# Patient Record
Sex: Female | Born: 1940 | Race: White | Hispanic: No | Marital: Married | Smoking: Never smoker
Health system: Southern US, Community
[De-identification: ages and names within clinical notes are randomized; demographics above are authoritative.]

## PROBLEM LIST (undated history)

## (undated) DIAGNOSIS — C801 Malignant (primary) neoplasm, unspecified: Secondary | ICD-10-CM

## (undated) DIAGNOSIS — J45909 Unspecified asthma, uncomplicated: Secondary | ICD-10-CM

## (undated) DIAGNOSIS — K8020 Calculus of gallbladder without cholecystitis without obstruction: Secondary | ICD-10-CM

## (undated) DIAGNOSIS — I10 Essential (primary) hypertension: Secondary | ICD-10-CM

## (undated) DIAGNOSIS — I5021 Acute systolic (congestive) heart failure: Secondary | ICD-10-CM

## (undated) DIAGNOSIS — N200 Calculus of kidney: Secondary | ICD-10-CM

## (undated) DIAGNOSIS — K745 Biliary cirrhosis, unspecified: Secondary | ICD-10-CM

## (undated) HISTORY — PX: CATARACT EXTRACTION: SUR2

## (undated) HISTORY — PX: REPLACEMENT TOTAL KNEE: SUR1224

## (undated) HISTORY — PX: BREAST LUMPECTOMY: SHX2

---

## 2016-01-17 ENCOUNTER — Encounter (HOSPITAL_COMMUNITY)
Admission: EM | Disposition: A | Discharge status: Home / Self Care | Payer: Self-pay | Source: Home / Self Care | Attending: Oncology

## 2016-01-17 ENCOUNTER — Encounter (HOSPITAL_COMMUNITY): Payer: Self-pay | Admitting: Emergency Medicine

## 2016-01-17 ENCOUNTER — Inpatient Hospital Stay (HOSPITAL_COMMUNITY)
Admission: EM | Admit: 2016-01-17 | Discharge: 2016-01-20 | DRG: 872 | Disposition: A | Discharge status: Home / Self Care | Payer: Medicare Other | Attending: Oncology | Admitting: Oncology

## 2016-01-17 ENCOUNTER — Emergency Department (HOSPITAL_COMMUNITY): Payer: Medicare Other

## 2016-01-17 DIAGNOSIS — Z87440 Personal history of urinary (tract) infections: Secondary | ICD-10-CM

## 2016-01-17 DIAGNOSIS — Z9221 Personal history of antineoplastic chemotherapy: Secondary | ICD-10-CM

## 2016-01-17 DIAGNOSIS — Z419 Encounter for procedure for purposes other than remedying health state, unspecified: Secondary | ICD-10-CM

## 2016-01-17 DIAGNOSIS — I5021 Acute systolic (congestive) heart failure: Secondary | ICD-10-CM | POA: Insufficient documentation

## 2016-01-17 DIAGNOSIS — N390 Urinary tract infection, site not specified: Secondary | ICD-10-CM

## 2016-01-17 DIAGNOSIS — Z6841 Body Mass Index (BMI) 40.0 and over, adult: Secondary | ICD-10-CM

## 2016-01-17 DIAGNOSIS — G4733 Obstructive sleep apnea (adult) (pediatric): Secondary | ICD-10-CM | POA: Insufficient documentation

## 2016-01-17 DIAGNOSIS — I5020 Unspecified systolic (congestive) heart failure: Secondary | ICD-10-CM | POA: Diagnosis present

## 2016-01-17 DIAGNOSIS — K743 Primary biliary cirrhosis: Secondary | ICD-10-CM | POA: Diagnosis present

## 2016-01-17 DIAGNOSIS — E662 Morbid (severe) obesity with alveolar hypoventilation: Secondary | ICD-10-CM | POA: Diagnosis present

## 2016-01-17 DIAGNOSIS — Z87442 Personal history of urinary calculi: Secondary | ICD-10-CM

## 2016-01-17 DIAGNOSIS — A419 Sepsis, unspecified organism: Principal | ICD-10-CM | POA: Diagnosis present

## 2016-01-17 DIAGNOSIS — J45909 Unspecified asthma, uncomplicated: Secondary | ICD-10-CM | POA: Diagnosis present

## 2016-01-17 DIAGNOSIS — A4151 Sepsis due to Escherichia coli [E. coli]: Secondary | ICD-10-CM | POA: Insufficient documentation

## 2016-01-17 DIAGNOSIS — Z87891 Personal history of nicotine dependence: Secondary | ICD-10-CM

## 2016-01-17 DIAGNOSIS — K745 Biliary cirrhosis, unspecified: Secondary | ICD-10-CM | POA: Diagnosis present

## 2016-01-17 DIAGNOSIS — Z9989 Dependence on other enabling machines and devices: Secondary | ICD-10-CM

## 2016-01-17 DIAGNOSIS — R1032 Left lower quadrant pain: Secondary | ICD-10-CM | POA: Diagnosis not present

## 2016-01-17 DIAGNOSIS — R0902 Hypoxemia: Secondary | ICD-10-CM | POA: Diagnosis not present

## 2016-01-17 DIAGNOSIS — N136 Pyonephrosis: Secondary | ICD-10-CM | POA: Diagnosis present

## 2016-01-17 DIAGNOSIS — I2510 Atherosclerotic heart disease of native coronary artery without angina pectoris: Secondary | ICD-10-CM | POA: Diagnosis present

## 2016-01-17 DIAGNOSIS — N200 Calculus of kidney: Secondary | ICD-10-CM | POA: Diagnosis present

## 2016-01-17 DIAGNOSIS — Z923 Personal history of irradiation: Secondary | ICD-10-CM

## 2016-01-17 DIAGNOSIS — Z853 Personal history of malignant neoplasm of breast: Secondary | ICD-10-CM

## 2016-01-17 DIAGNOSIS — K219 Gastro-esophageal reflux disease without esophagitis: Secondary | ICD-10-CM | POA: Diagnosis present

## 2016-01-17 DIAGNOSIS — N201 Calculus of ureter: Secondary | ICD-10-CM | POA: Diagnosis present

## 2016-01-17 DIAGNOSIS — R0989 Other specified symptoms and signs involving the circulatory and respiratory systems: Secondary | ICD-10-CM

## 2016-01-17 DIAGNOSIS — N3090 Cystitis, unspecified without hematuria: Secondary | ICD-10-CM | POA: Diagnosis present

## 2016-01-17 DIAGNOSIS — N179 Acute kidney failure, unspecified: Secondary | ICD-10-CM | POA: Diagnosis present

## 2016-01-17 DIAGNOSIS — I110 Hypertensive heart disease with heart failure: Secondary | ICD-10-CM | POA: Diagnosis present

## 2016-01-17 HISTORY — DX: Malignant (primary) neoplasm, unspecified: C80.1

## 2016-01-17 HISTORY — PX: CYSTOSCOPY W/ URETERAL STENT PLACEMENT: SHX1429

## 2016-01-17 HISTORY — DX: Unspecified asthma, uncomplicated: J45.909

## 2016-01-17 HISTORY — DX: Essential (primary) hypertension: I10

## 2016-01-17 HISTORY — DX: Acute systolic (congestive) heart failure: I50.21

## 2016-01-17 HISTORY — DX: Calculus of gallbladder without cholecystitis without obstruction: K80.20

## 2016-01-17 HISTORY — DX: Biliary cirrhosis, unspecified: K74.5

## 2016-01-17 HISTORY — DX: Calculus of kidney: N20.0

## 2016-01-17 LAB — CBC
HCT: 38.6 % (ref 36.0–46.0)
Hemoglobin: 12.3 g/dL (ref 12.0–15.0)
MCH: 27.9 pg (ref 26.0–34.0)
MCHC: 31.9 g/dL (ref 30.0–36.0)
MCV: 87.5 fL (ref 78.0–100.0)
PLATELETS: 285 10*3/uL (ref 150–400)
RBC: 4.41 MIL/uL (ref 3.87–5.11)
RDW: 13.9 % (ref 11.5–15.5)
WBC: 10.8 10*3/uL — AB (ref 4.0–10.5)

## 2016-01-17 LAB — URINALYSIS, ROUTINE W REFLEX MICROSCOPIC
Bilirubin Urine: NEGATIVE
Glucose, UA: NEGATIVE mg/dL
Ketones, ur: NEGATIVE mg/dL
Nitrite: NEGATIVE
PROTEIN: NEGATIVE mg/dL
Specific Gravity, Urine: 1.02 (ref 1.005–1.030)
pH: 6 (ref 5.0–8.0)

## 2016-01-17 LAB — COMPREHENSIVE METABOLIC PANEL
ALK PHOS: 63 U/L (ref 38–126)
ALT: 23 U/L (ref 14–54)
AST: 46 U/L — ABNORMAL HIGH (ref 15–41)
Albumin: 3.7 g/dL (ref 3.5–5.0)
Anion gap: 10 (ref 5–15)
BUN: 14 mg/dL (ref 6–20)
CALCIUM: 9.3 mg/dL (ref 8.9–10.3)
CHLORIDE: 102 mmol/L (ref 101–111)
CO2: 24 mmol/L (ref 22–32)
CREATININE: 0.99 mg/dL (ref 0.44–1.00)
GFR, EST NON AFRICAN AMERICAN: 54 mL/min — AB (ref >60)
Glucose, Bld: 127 mg/dL — ABNORMAL HIGH (ref 65–99)
Potassium: 4.7 mmol/L (ref 3.5–5.1)
Sodium: 136 mmol/L (ref 135–145)
Total Bilirubin: 1.4 mg/dL — ABNORMAL HIGH (ref 0.3–1.2)
Total Protein: 7.2 g/dL (ref 6.5–8.1)

## 2016-01-17 LAB — URINE MICROSCOPIC-ADD ON

## 2016-01-17 LAB — I-STAT CG4 LACTIC ACID, ED: Lactic Acid, Venous: 3.93 mmol/L (ref 0.5–2.0)

## 2016-01-17 SURGERY — CYSTOSCOPY, WITH RETROGRADE PYELOGRAM AND URETERAL STENT INSERTION
Anesthesia: General | Site: Pelvis | Laterality: Left

## 2016-01-17 MED ORDER — ONDANSETRON HCL 4 MG/2ML IJ SOLN
4.0000 mg | Freq: Once | INTRAMUSCULAR | Status: AC
  Administered 2016-01-17: 4 mg via INTRAVENOUS
  Filled 2016-01-17: qty 2

## 2016-01-17 MED ORDER — ACETAMINOPHEN 500 MG PO TABS
1000.0000 mg | ORAL_TABLET | Freq: Once | ORAL | Status: AC
  Administered 2016-01-17: 1000 mg via ORAL
  Filled 2016-01-17: qty 2

## 2016-01-17 MED ORDER — HYDROMORPHONE HCL 1 MG/ML IJ SOLN
0.5000 mg | Freq: Once | INTRAMUSCULAR | Status: AC
Start: 2016-01-17 — End: 2016-01-17
  Administered 2016-01-17: 0.5 mg via INTRAVENOUS
  Filled 2016-01-17: qty 1

## 2016-01-17 MED ORDER — PROPOFOL 10 MG/ML IV BOLUS
INTRAVENOUS | Status: AC
  Filled 2016-01-17: qty 20

## 2016-01-17 MED ORDER — SUCCINYLCHOLINE CHLORIDE 200 MG/10ML IV SOSY
PREFILLED_SYRINGE | INTRAVENOUS | Status: AC
  Filled 2016-01-17: qty 10

## 2016-01-17 MED ORDER — PHENYLEPHRINE 40 MCG/ML (10ML) SYRINGE FOR IV PUSH (FOR BLOOD PRESSURE SUPPORT)
PREFILLED_SYRINGE | INTRAVENOUS | Status: AC
  Filled 2016-01-17: qty 10

## 2016-01-17 MED ORDER — SODIUM CHLORIDE 0.9 % IV BOLUS (SEPSIS)
1000.0000 mL | Freq: Once | INTRAVENOUS | Status: AC
  Administered 2016-01-17: 1000 mL via INTRAVENOUS

## 2016-01-17 MED ORDER — MORPHINE SULFATE (PF) 4 MG/ML IV SOLN
4.0000 mg | Freq: Once | INTRAVENOUS | Status: AC
  Administered 2016-01-17: 4 mg via INTRAVENOUS
  Filled 2016-01-17: qty 1

## 2016-01-17 MED ORDER — DEXTROSE 5 % IV SOLN
1.0000 g | Freq: Once | INTRAVENOUS | Status: AC
  Administered 2016-01-17: 1 g via INTRAVENOUS
  Filled 2016-01-17 (×2): qty 10

## 2016-01-17 MED ORDER — IOPAMIDOL (ISOVUE-300) INJECTION 61%
INTRAVENOUS | Status: AC
  Administered 2016-01-17: 100 mL
  Filled 2016-01-17: qty 100

## 2016-01-17 MED ORDER — SODIUM CHLORIDE 0.9 % IV BOLUS (SEPSIS)
500.0000 mL | Freq: Once | INTRAVENOUS | Status: AC
  Administered 2016-01-17: 500 mL via INTRAVENOUS

## 2016-01-17 MED ORDER — LIDOCAINE 2% (20 MG/ML) 5 ML SYRINGE
INTRAMUSCULAR | Status: AC
  Filled 2016-01-17: qty 5

## 2016-01-17 MED ORDER — FENTANYL CITRATE (PF) 250 MCG/5ML IJ SOLN
INTRAMUSCULAR | Status: AC
  Filled 2016-01-17: qty 5

## 2016-01-17 MED ORDER — EPHEDRINE 5 MG/ML INJ
INTRAVENOUS | Status: AC
  Filled 2016-01-17: qty 10

## 2016-01-17 SURGICAL SUPPLY — 35 items
ADAPTER CATH URET PLST 4-6FR (CATHETERS) IMPLANT
BAG URINE DRAINAGE (UROLOGICAL SUPPLIES) ×2 IMPLANT
BAG URO CATCHER STRL LF (MISCELLANEOUS) ×2 IMPLANT
BENZOIN TINCTURE PRP APPL 2/3 (GAUZE/BANDAGES/DRESSINGS) IMPLANT
BLADE 10 SAFETY STRL DISP (BLADE) ×2 IMPLANT
BUCKET BIOHAZARD WASTE 5 GAL (MISCELLANEOUS) IMPLANT
CATH FOLEY 2WAY SLVR  5CC 16FR (CATHETERS)
CATH FOLEY 2WAY SLVR 5CC 16FR (CATHETERS) IMPLANT
CATH INTERMIT  6FR 70CM (CATHETERS) IMPLANT
CATH URET 5FR 28IN CONE TIP (BALLOONS)
CATH URET 5FR 28IN OPEN ENDED (CATHETERS) ×2 IMPLANT
CATH URET 5FR 70CM CONE TIP (BALLOONS) IMPLANT
COVER SURGICAL LIGHT HANDLE (MISCELLANEOUS) ×2 IMPLANT
DRAPE CAMERA CLOSED 9X96 (DRAPES) ×2 IMPLANT
GLOVE BIO SURGEON STRL SZ7.5 (GLOVE) ×4 IMPLANT
GLOVE BIOGEL PI IND STRL 8 (GLOVE) ×1 IMPLANT
GLOVE BIOGEL PI INDICATOR 8 (GLOVE) ×1
GOWN STRL REUS W/ TWL XL LVL3 (GOWN DISPOSABLE) ×2 IMPLANT
GOWN STRL REUS W/TWL XL LVL3 (GOWN DISPOSABLE) ×2
GUIDEWIRE ANG ZIPWIRE 038X150 (WIRE) IMPLANT
GUIDEWIRE COOK  .035 (WIRE) IMPLANT
GUIDEWIRE STR DUAL SENSOR (WIRE) ×2 IMPLANT
KIT ROOM TURNOVER OR (KITS) ×2 IMPLANT
MANIFOLD NEPTUNE II (INSTRUMENTS) ×2 IMPLANT
NS IRRIG 1000ML POUR BTL (IV SOLUTION) ×2 IMPLANT
PACK CYSTOSCOPY (CUSTOM PROCEDURE TRAY) ×2 IMPLANT
PAD ARMBOARD 7.5X6 YLW CONV (MISCELLANEOUS) ×4 IMPLANT
PLUG CATH AND CAP STER (CATHETERS) IMPLANT
SET CYSTO W/LG BORE CLAMP LF (SET/KITS/TRAYS/PACK) ×2 IMPLANT
STENT URET 6FRX24 CONTOUR (STENTS) ×2 IMPLANT
SYRINGE CONTROL L 12CC (SYRINGE) ×2 IMPLANT
SYRINGE TOOMEY DISP (SYRINGE) IMPLANT
UNDERPAD 30X30 INCONTINENT (UNDERPADS AND DIAPERS) ×2 IMPLANT
WATER STERILE IRR 1000ML POUR (IV SOLUTION) ×2 IMPLANT
WIRE COONS/BENSON .038X145CM (WIRE) IMPLANT

## 2016-01-17 NOTE — ED Notes (Signed)
Pt. reports worsening LLQ pain radiating to left flank onset noon today , denies hematuria or dysuria , mild nausea with emesis x1.  Pt. endorses history of kidney stones and gall stones . Denies fever or chills.

## 2016-01-17 NOTE — ED Notes (Signed)
Hx of apnea.  Placed on 3L O2 for low sats after receiving pain meds.

## 2016-01-17 NOTE — H&P (Addendum)
Subjective: CC: Left flank pain  Hx: Dominique Mcdonald is a 75 yo WF visiting from [facility] who I was asked see in consultation by Dr. Wyvonnia Dusky for a 47mm left UPJ stone with sepsis.   She had the onset at about noon today of moderately severe left flank and lower quadrant pain and nausea with vomiting.    She had some irritative voiding symptoms but no hematuria.   He had a chill and was found to have a temp of 104 in the ER with an elevated lactic acid of almost 4 and infected urine.  She has a history of UTI's and OAB and is on nitrofurantoin, Toviaz and Oxybutynin and the stone was known to be in the left kidney by her urologist in [place].    She has had no other stones or GU history.  She has received Rocephin and a Culture is pending.  ROS:  Review of Systems  Constitutional: Positive for fever and chills.  HENT: Positive for hearing loss.   Respiratory: Positive for shortness of breath.   Cardiovascular: Negative for chest pain.  Gastrointestinal: Positive for abdominal pain.  Genitourinary: Positive for urgency, frequency and flank pain.  All other systems reviewed and are negative.   Allergies  Allergen Reactions  . Penicillins Other (See Comments)    Family allergy   . Sulfa Antibiotics Rash    Nitrofurantoin  Past Medical History  Diagnosis Date  . Biliary cirrhosis ([place])   . Cholelithiasis   . Nephrolithiasis   . Cancer ([place])   . Asthma   . Hypertension     Past Surgical History  Procedure Laterality Date  . Cataract extraction    . Replacement total knee    . Breast lumpectomy      Social History   Social History  . Marital Status: Married    Spouse Name: N/A  . Number of Children: N/A  . Years of Education: N/A   Occupational History  . Not on file.   Social History Main Topics  . Smoking status: Never Smoker   . Smokeless tobacco: Not on file  . Alcohol Use: No  . Drug Use: No  . Sexual Activity: Not on file   Other Topics Concern  .  Not on file   Social History Narrative  . No narrative on file   Past medical, surgical and social history reviewed and updatged.   No family history on file.  Anti-infectives: Anti-infectives    Start     Dose/Rate Route Frequency Ordered Stop   01/17/16 2200  cefTRIAXone (ROCEPHIN) 1 g in dextrose 5 % 50 mL IVPB     1 g 100 mL/hr over 30 Minutes Intravenous  Once 01/17/16 2154 01/17/16 2246      No current facility-administered medications for this encounter.   No current outpatient prescriptions on file.   Meds: Atenolol 100mg  daily Atorvastativ 20mg  daily Qvar 80 mcq Inhaler Toviaz 8mg  Daily Paroxetine 40mg  daily Pantoprazole 40mg  Daily Ropinerole 1mg  daily Ursodiol 300mg    Objective: Vital signs in last 24 hours: Temp:  [97.7 F (36.5 C)-104.1 F (40.1 C)] 104.1 F (40.1 C) (06/11 2218) Pulse Rate:  [78-97] 97 (06/11 2230) Resp:  [16-28] 28 (06/11 2230) BP: (104-165)/(49-103) 104/49 mmHg (06/11 2230) SpO2:  [92 %-100 %] 92 % (06/11 2230)  Intake/Output from previous day:   Intake/Output this shift:     Physical Exam  Constitutional: She is oriented to person, place, and time and well-developed, well-nourished, and  in no distress.  HENT:  Head: Normocephalic and atraumatic.  Neck: Normal range of motion. Neck supple.  Cardiovascular: Normal rate, regular rhythm and normal heart sounds.   Pulmonary/Chest: Effort normal and breath sounds normal. No respiratory distress.  Abdominal: Soft. She exhibits no distension and no mass. There is tenderness (LLQ moderate). There is no rebound and no guarding.  Musculoskeletal: Normal range of motion. She exhibits no edema or tenderness.  Lymphadenopathy:    She has no cervical adenopathy.       Right: No inguinal and no supraclavicular adenopathy present.       Left: No inguinal and no supraclavicular adenopathy present.  Neurological: She is alert and oriented to person, place, and time.  Skin: Skin is warm and  dry.  Psychiatric: Mood and affect normal.    Lab Results:   Recent Labs  01/17/16 1939  WBC 10.8*  HGB 12.3  HCT 38.6  PLT 285   BMET  Recent Labs  01/17/16 1939  NA 136  K 4.7  CL 102  CO2 24  GLUCOSE 127*  BUN 14  CREATININE 0.99  CALCIUM 9.3   PT/INR No results for input(s): LABPROT, INR in the last 72 hours. ABG No results for input(s): PHART, HCO3 in the last 72 hours.  Invalid input(s): PCO2, PO2  Studies/Results: Ct Abdomen Pelvis W Contrast  01/17/2016  CLINICAL DATA:  Left lower quadrant abdominal pain and left flank pain beginning and Ing at today. EXAM: CT ABDOMEN AND PELVIS WITH CONTRAST TECHNIQUE: Multidetector CT imaging of the abdomen and pelvis was performed using the standard protocol following bolus administration of intravenous contrast. CONTRAST:  1 ISOVUE-300 IOPAMIDOL (ISOVUE-300) INJECTION 61% COMPARISON:  None. FINDINGS: Lower chest: The lung bases demonstrate some chronic change. No focal airspace disease is present. Heart size is normal. Coronary artery calcifications are present. There also calcifications of the mitral valve. Hepatobiliary: An 11 mm lesion in the lateral aspect of the right lobe is compatible with a simple cyst. No other focal lesions are present. 11 mm stone is present at the neck of the gallbladder. There is no inflammatory change. The common bile duct is within normal limits. Pancreas: The a 2.1 cm low-density cysts is present in the tail the pancreas. No other discrete lesions are present. The pancreatic duct is within normal limits. Spleen: A single punctate calcification is present in the central aspect of the splaying. No other focal lesions are present. Adrenals/Urinary Tract: The adrenal glands are normal bilaterally. A 12 mm benign appearing cysts is present at the upper pole of the right kidney. A second 12 mm cyst is present at the lower pole of the right kidney. Moderate left-sided hydronephrosis is present. An obstructing  12 mm stone is present at the left UPJ. The more distal ureters are within normal limits bilaterally. No additional stones are present. The urinary bladder is mostly collapsed. Stomach/Bowel: A moderate-sized hiatal hernia is present. The stomach is otherwise unremarkable. The duodenum is within normal limits. Small bowel is within normal limits is well. The appendix is visualized and normal. The cecum is unremarkable. The ascending transverse colon are within normal limits. The descending rectosigmoid colon are unremarkable. Vascular/Lymphatic: Extensive atherosclerotic calcifications are present in the aorta and branch vessels. There is no aneurysm. Reproductive: Uterus and adnexa are within normal limits for age. Other: No significant free fluid is present. Musculoskeletal: A vacuum disc is present at L5-S1. Multilevel facet degenerative changes are present in the lower lumbar spine. By vacuum  discs in the lower thoracic spine is well. Vertebral body heights and alignment are maintained. The pelvis is intact. IMPRESSION: 1. Obstructing 12 mm stone at the left UPJ with moderate left-sided hydronephrosis. 2. Benign-appearing simple cysts at the upper and lower pole of the right kidney. 3. No additional nephrolithiasis. 4. Benign-appearing low-density lesion at the tail the pancreas likely represents a simple cyst. Recommend MRI of the pancreas with MRCP This recommendation follows ACR consensus guidelines: Managing Incidental Findings on Abdominal CT: White Paper of the ACR Incidental Findings Committee. J Am Coll Radiol 2010;7:754-773. 5. Moderate-sized hiatal hernia. 6. Extensive atherosclerotic disease including coronary artery disease. Electronically Signed   By: San Morelle M.D.   On: 01/17/2016 21:27   I have discussed the case with Dr. Wyvonnia Dusky and reviewed the CT films and report and labs.  Assessment: 12 mm LUPJ stone with sepsis.  I discussed the options for therapy including cystoscopy and  stenting vs left perc tube and feel she is an appropriate candidate for emergent stenting.   I have reviewed the risks of the procedure including bleeding, worsening sepsis, ureteral injury, inability to pass the stent with subsequent need for perc, thrombotic events and anesthetic risks and I discussed the need for eventual definitive stone treatment when she returns to [place].     CC: Dr. Richardson Landry Rancour     Irine Seal J 01/18/2016 [phone]

## 2016-01-17 NOTE — ED Provider Notes (Signed)
CSN: [id number]     Arrival date & time 01/17/16  1858 History   First MD Initiated Contact with Patient 01/17/16 1917     Chief Complaint  Patient presents with  . Abdominal Pain  . Flank Pain   HPI Pt. reports worsening LLQ pain radiating to left flank onset noon today, denies hematuria or dysuria, mild nausea with emesis x1. Does have a history of urinary tract infections but denies any dysuria. She does endorse a history of kidney stone and gallstone. Denies fever or chills. Patient has not taken anything for pain. Her emesis with yellow colored without blood or bile. Denies any rectal bleeding. Denies any shortness of breath or chest pain. States she does feel fatigued. She is visiting family from [place].  Past Medical History  Diagnosis Date  . Biliary cirrhosis ([place])   . Cholelithiasis   . Nephrolithiasis   . Cancer ([place])   . Asthma   . Hypertension    Past Surgical History  Procedure Laterality Date  . Cataract extraction    . Replacement total knee    . Breast lumpectomy     No family history on file. Social History  Substance Use Topics  . Smoking status: Never Smoker   . Smokeless tobacco: None  . Alcohol Use: No   OB History    No data available     Review of Systems  Constitutional: Negative for fever.  Allergic/Immunologic: Negative for immunocompromised state.  All other systems reviewed and are negative.     Allergies  Nitrofurantoin; Penicillins; and Sulfa antibiotics  Home Medications   Prior to Admission medications   Not on File   BP 150/88 mmHg  Pulse 81  Temp(Src) 97.7 F (36.5 C) (Oral)  Resp 17  SpO2 100% Physical Exam  Constitutional: She is oriented to person, place, and time. She appears well-developed and well-nourished. No distress.  HENT:  Head: Normocephalic and atraumatic.  Eyes: Conjunctivae are normal. Right eye exhibits no discharge. Left eye exhibits no discharge.  Neck: Normal range of motion. Neck supple.   Cardiovascular: Normal rate and regular rhythm.   Pulmonary/Chest: Effort normal and breath sounds normal. No respiratory distress.  Abdominal: Soft. Bowel sounds are normal. She exhibits no distension and no mass. There is tenderness (LLQ). There is guarding (LLQ). There is no rebound.  Neg cvat but tender to deep palpation of left sided muscles, mild Neg murphys sign obese  Musculoskeletal: She exhibits no edema.  Neurological: She is alert and oriented to person, place, and time.  Skin: Skin is warm. No rash noted.  Psychiatric: She has a normal mood and affect.  Nursing note and vitals reviewed.   ED Course  Procedures (including critical care time) Labs Review Labs Reviewed  COMPREHENSIVE METABOLIC PANEL - Abnormal; Notable for the following:    Glucose, Bld 127 (*)    AST 46 (*)    Total Bilirubin 1.4 (*)    GFR calc non Af Amer 54 (*)    All other components within normal limits  CBC - Abnormal; Notable for the following:    WBC 10.8 (*)    All other components within normal limits  URINALYSIS, ROUTINE W REFLEX MICROSCOPIC (NOT AT [facility]) - Abnormal; Notable for the following:    APPearance CLOUDY (*)    Hgb urine dipstick MODERATE (*)    Leukocytes, UA MODERATE (*)    All other components within normal limits  URINE MICROSCOPIC-ADD ON - Abnormal; Notable for the following:  Squamous Epithelial / LPF 0-5 (*)    Bacteria, UA FEW (*)    All other components within normal limits  I-STAT CG4 LACTIC ACID, ED - Abnormal; Notable for the following:    Lactic Acid, Venous 3.93 (*)    All other components within normal limits  CULTURE, BLOOD (ROUTINE X 2)  URINE CULTURE  CULTURE, BLOOD (ROUTINE X 2)  LIPASE, BLOOD  I-STAT CG4 LACTIC ACID, ED    Imaging Review Ct Abdomen Pelvis W Contrast  01/17/2016  CLINICAL DATA:  Left lower quadrant abdominal pain and left flank pain beginning and Ing at today. EXAM: CT ABDOMEN AND PELVIS WITH CONTRAST TECHNIQUE: Multidetector CT  imaging of the abdomen and pelvis was performed using the standard protocol following bolus administration of intravenous contrast. CONTRAST:  1 ISOVUE-300 IOPAMIDOL (ISOVUE-300) INJECTION 61% COMPARISON:  None. FINDINGS: Lower chest: The lung bases demonstrate some chronic change. No focal airspace disease is present. Heart size is normal. Coronary artery calcifications are present. There also calcifications of the mitral valve. Hepatobiliary: An 11 mm lesion in the lateral aspect of the right lobe is compatible with a simple cyst. No other focal lesions are present. 11 mm stone is present at the neck of the gallbladder. There is no inflammatory change. The common bile duct is within normal limits. Pancreas: The a 2.1 cm low-density cysts is present in the tail the pancreas. No other discrete lesions are present. The pancreatic duct is within normal limits. Spleen: A single punctate calcification is present in the central aspect of the splaying. No other focal lesions are present. Adrenals/Urinary Tract: The adrenal glands are normal bilaterally. A 12 mm benign appearing cysts is present at the upper pole of the right kidney. A second 12 mm cyst is present at the lower pole of the right kidney. Moderate left-sided hydronephrosis is present. An obstructing 12 mm stone is present at the left UPJ. The more distal ureters are within normal limits bilaterally. No additional stones are present. The urinary bladder is mostly collapsed. Stomach/Bowel: A moderate-sized hiatal hernia is present. The stomach is otherwise unremarkable. The duodenum is within normal limits. Small bowel is within normal limits is well. The appendix is visualized and normal. The cecum is unremarkable. The ascending transverse colon are within normal limits. The descending rectosigmoid colon are unremarkable. Vascular/Lymphatic: Extensive atherosclerotic calcifications are present in the aorta and branch vessels. There is no aneurysm.  Reproductive: Uterus and adnexa are within normal limits for age. Other: No significant free fluid is present. Musculoskeletal: A vacuum disc is present at L5-S1. Multilevel facet degenerative changes are present in the lower lumbar spine. By vacuum discs in the lower thoracic spine is well. Vertebral body heights and alignment are maintained. The pelvis is intact. IMPRESSION: 1. Obstructing 12 mm stone at the left UPJ with moderate left-sided hydronephrosis. 2. Benign-appearing simple cysts at the upper and lower pole of the right kidney. 3. No additional nephrolithiasis. 4. Benign-appearing low-density lesion at the tail the pancreas likely represents a simple cyst. Recommend MRI of the pancreas with MRCP This recommendation follows ACR consensus guidelines: Managing Incidental Findings on Abdominal CT: White Paper of the ACR Incidental Findings Committee. J Am Coll Radiol 2010;7:754-773. 5. Moderate-sized hiatal hernia. 6. Extensive atherosclerotic disease including coronary artery disease. Electronically Signed   By: San Morelle M.D.   On: 01/17/2016 21:27   I have personally reviewed and evaluated these images and lab results as part of my medical decision-making.   EKG Interpretation None  MDM   Final diagnoses:  Sepsis, due to unspecified organism ([place])  Obstruction of left ureteropelvic junction (UPJ) due to stone   Patient is septic secondary to urinary tract infection secondary to left UPJ stone that is obstructed with hydronephrosis. Patient given Rocephin, fluids. She became febrile while in the emergency department and Tylenol administered. Urology consultation who will take to the operating room emergently. Patient will be admitted.  Karma Greaser, MD 01/18/16 0122  Ezequiel Essex, MD 01/18/16 [phone]

## 2016-01-18 ENCOUNTER — Encounter (HOSPITAL_COMMUNITY): Payer: Self-pay | Admitting: Internal Medicine

## 2016-01-18 ENCOUNTER — Inpatient Hospital Stay (HOSPITAL_COMMUNITY): Payer: Medicare Other

## 2016-01-18 ENCOUNTER — Emergency Department (HOSPITAL_COMMUNITY): Payer: Medicare Other | Admitting: Anesthesiology

## 2016-01-18 DIAGNOSIS — K8689 Other specified diseases of pancreas: Secondary | ICD-10-CM | POA: Diagnosis not present

## 2016-01-18 DIAGNOSIS — B9689 Other specified bacterial agents as the cause of diseases classified elsewhere: Secondary | ICD-10-CM | POA: Diagnosis not present

## 2016-01-18 DIAGNOSIS — N10 Acute tubulo-interstitial nephritis: Secondary | ICD-10-CM

## 2016-01-18 DIAGNOSIS — Z9221 Personal history of antineoplastic chemotherapy: Secondary | ICD-10-CM | POA: Diagnosis not present

## 2016-01-18 DIAGNOSIS — Z6841 Body Mass Index (BMI) 40.0 and over, adult: Secondary | ICD-10-CM | POA: Diagnosis not present

## 2016-01-18 DIAGNOSIS — N201 Calculus of ureter: Secondary | ICD-10-CM | POA: Diagnosis present

## 2016-01-18 DIAGNOSIS — R1032 Left lower quadrant pain: Secondary | ICD-10-CM | POA: Diagnosis present

## 2016-01-18 DIAGNOSIS — N390 Urinary tract infection, site not specified: Secondary | ICD-10-CM

## 2016-01-18 DIAGNOSIS — Z960 Presence of urogenital implants: Secondary | ICD-10-CM | POA: Diagnosis not present

## 2016-01-18 DIAGNOSIS — E662 Morbid (severe) obesity with alveolar hypoventilation: Secondary | ICD-10-CM | POA: Diagnosis present

## 2016-01-18 DIAGNOSIS — K743 Primary biliary cirrhosis: Secondary | ICD-10-CM | POA: Diagnosis present

## 2016-01-18 DIAGNOSIS — I517 Cardiomegaly: Secondary | ICD-10-CM | POA: Diagnosis not present

## 2016-01-18 DIAGNOSIS — A419 Sepsis, unspecified organism: Secondary | ICD-10-CM | POA: Diagnosis present

## 2016-01-18 DIAGNOSIS — J45909 Unspecified asthma, uncomplicated: Secondary | ICD-10-CM | POA: Diagnosis present

## 2016-01-18 DIAGNOSIS — N179 Acute kidney failure, unspecified: Secondary | ICD-10-CM | POA: Diagnosis present

## 2016-01-18 DIAGNOSIS — I2510 Atherosclerotic heart disease of native coronary artery without angina pectoris: Secondary | ICD-10-CM | POA: Diagnosis present

## 2016-01-18 DIAGNOSIS — N3090 Cystitis, unspecified without hematuria: Secondary | ICD-10-CM | POA: Diagnosis present

## 2016-01-18 DIAGNOSIS — I110 Hypertensive heart disease with heart failure: Secondary | ICD-10-CM | POA: Diagnosis present

## 2016-01-18 DIAGNOSIS — Z923 Personal history of irradiation: Secondary | ICD-10-CM | POA: Diagnosis not present

## 2016-01-18 DIAGNOSIS — Z853 Personal history of malignant neoplasm of breast: Secondary | ICD-10-CM | POA: Diagnosis not present

## 2016-01-18 DIAGNOSIS — K219 Gastro-esophageal reflux disease without esophagitis: Secondary | ICD-10-CM | POA: Diagnosis present

## 2016-01-18 DIAGNOSIS — I10 Essential (primary) hypertension: Secondary | ICD-10-CM

## 2016-01-18 DIAGNOSIS — Z87440 Personal history of urinary (tract) infections: Secondary | ICD-10-CM | POA: Diagnosis not present

## 2016-01-18 DIAGNOSIS — I951 Orthostatic hypotension: Secondary | ICD-10-CM

## 2016-01-18 DIAGNOSIS — I5020 Unspecified systolic (congestive) heart failure: Secondary | ICD-10-CM | POA: Diagnosis present

## 2016-01-18 DIAGNOSIS — R0902 Hypoxemia: Secondary | ICD-10-CM | POA: Diagnosis not present

## 2016-01-18 DIAGNOSIS — Z9989 Dependence on other enabling machines and devices: Secondary | ICD-10-CM

## 2016-01-18 DIAGNOSIS — Z87442 Personal history of urinary calculi: Secondary | ICD-10-CM | POA: Diagnosis not present

## 2016-01-18 DIAGNOSIS — G4733 Obstructive sleep apnea (adult) (pediatric): Secondary | ICD-10-CM

## 2016-01-18 DIAGNOSIS — N136 Pyonephrosis: Secondary | ICD-10-CM | POA: Diagnosis present

## 2016-01-18 DIAGNOSIS — B9620 Unspecified Escherichia coli [E. coli] as the cause of diseases classified elsewhere: Secondary | ICD-10-CM | POA: Diagnosis not present

## 2016-01-18 DIAGNOSIS — K745 Biliary cirrhosis, unspecified: Secondary | ICD-10-CM | POA: Diagnosis present

## 2016-01-18 DIAGNOSIS — Z87891 Personal history of nicotine dependence: Secondary | ICD-10-CM | POA: Diagnosis not present

## 2016-01-18 DIAGNOSIS — N200 Calculus of kidney: Secondary | ICD-10-CM | POA: Diagnosis present

## 2016-01-18 LAB — LACTIC ACID, PLASMA
LACTIC ACID, VENOUS: 3.5 mmol/L — AB (ref 0.5–2.0)
LACTIC ACID, VENOUS: 3.8 mmol/L — AB (ref 0.5–2.0)

## 2016-01-18 LAB — CBC
HEMATOCRIT: 30.7 % — AB (ref 36.0–46.0)
Hemoglobin: 9.5 g/dL — ABNORMAL LOW (ref 12.0–15.0)
MCH: 27.2 pg (ref 26.0–34.0)
MCHC: 30.6 g/dL (ref 30.0–36.0)
MCV: 88.7 fL (ref 78.0–100.0)
PLATELETS: 185 10*3/uL (ref 150–400)
RBC: 3.46 MIL/uL — AB (ref 3.87–5.11)
RDW: 14.4 % (ref 11.5–15.5)
WBC: 13.4 10*3/uL — ABNORMAL HIGH (ref 4.0–10.5)

## 2016-01-18 LAB — BASIC METABOLIC PANEL
ANION GAP: 9 (ref 5–15)
BUN: 17 mg/dL (ref 6–20)
CALCIUM: 7.9 mg/dL — AB (ref 8.9–10.3)
CO2: 23 mmol/L (ref 22–32)
Chloride: 108 mmol/L (ref 101–111)
Creatinine, Ser: 1.45 mg/dL — ABNORMAL HIGH (ref 0.44–1.00)
GFR, EST AFRICAN AMERICAN: 40 mL/min — AB (ref >60)
GFR, EST NON AFRICAN AMERICAN: 34 mL/min — AB (ref >60)
GLUCOSE: 97 mg/dL (ref 65–99)
POTASSIUM: 3.1 mmol/L — AB (ref 3.5–5.1)
Sodium: 140 mmol/L (ref 135–145)

## 2016-01-18 LAB — GLUCOSE, CAPILLARY
GLUCOSE-CAPILLARY: 90 mg/dL (ref 65–99)
Glucose-Capillary: 88 mg/dL (ref 65–99)
Glucose-Capillary: 80 mg/dL (ref 65–99)
Glucose-Capillary: 107 mg/dL — ABNORMAL HIGH (ref 65–99)

## 2016-01-18 LAB — MRSA PCR SCREENING: MRSA BY PCR: NEGATIVE

## 2016-01-18 MED ORDER — POTASSIUM CHLORIDE CRYS ER 20 MEQ PO TBCR
40.0000 meq | EXTENDED_RELEASE_TABLET | Freq: Two times a day (BID) | ORAL | Status: AC
  Administered 2016-01-18 (×2): 40 meq via ORAL
  Filled 2016-01-18 (×2): qty 2

## 2016-01-18 MED ORDER — PROPOFOL 10 MG/ML IV BOLUS
INTRAVENOUS | Status: DC | PRN
  Administered 2016-01-18: 100 mg via INTRAVENOUS
  Administered 2016-01-18: 50 mg via INTRAVENOUS

## 2016-01-18 MED ORDER — SODIUM CHLORIDE 0.9 % IV SOLN
INTRAVENOUS | Status: AC
  Administered 2016-01-18: 12:00:00 via INTRAVENOUS

## 2016-01-18 MED ORDER — SUCCINYLCHOLINE CHLORIDE 20 MG/ML IJ SOLN
INTRAMUSCULAR | Status: DC | PRN
  Administered 2016-01-18: 120 mg via INTRAVENOUS

## 2016-01-18 MED ORDER — ACETAMINOPHEN 325 MG PO TABS
650.0000 mg | ORAL_TABLET | ORAL | Status: DC | PRN
  Administered 2016-01-18 – 2016-01-20 (×7): 650 mg via ORAL
  Filled 2016-01-18 (×6): qty 2

## 2016-01-18 MED ORDER — ALBUTEROL SULFATE (2.5 MG/3ML) 0.083% IN NEBU
2.5000 mg | INHALATION_SOLUTION | RESPIRATORY_TRACT | Status: DC | PRN
  Administered 2016-01-19: 2.5 mg via RESPIRATORY_TRACT
  Filled 2016-01-18: qty 3

## 2016-01-18 MED ORDER — ROPINIROLE HCL 1 MG PO TABS
1.0000 mg | ORAL_TABLET | Freq: Every day | ORAL | Status: DC
  Administered 2016-01-18 – 2016-01-19 (×2): 1 mg via ORAL
  Filled 2016-01-18 (×3): qty 1

## 2016-01-18 MED ORDER — LIDOCAINE HCL (CARDIAC) 20 MG/ML IV SOLN
INTRAVENOUS | Status: DC | PRN
  Administered 2016-01-18: 100 mg via INTRATRACHEAL

## 2016-01-18 MED ORDER — ALBUMIN HUMAN 5 % IV SOLN
INTRAVENOUS | Status: AC
  Filled 2016-01-18: qty 250

## 2016-01-18 MED ORDER — FLEET ENEMA 7-19 GM/118ML RE ENEM
1.0000 | ENEMA | Freq: Once | RECTAL | Status: DC | PRN
  Filled 2016-01-18: qty 1

## 2016-01-18 MED ORDER — IOPAMIDOL (ISOVUE-300) INJECTION 61%
INTRAVENOUS | Status: AC
  Filled 2016-01-18: qty 50

## 2016-01-18 MED ORDER — EPHEDRINE SULFATE 50 MG/ML IJ SOLN
INTRAMUSCULAR | Status: DC | PRN
  Administered 2016-01-18: 10 mg via INTRAVENOUS
  Administered 2016-01-18: 5 mg via INTRAVENOUS

## 2016-01-18 MED ORDER — KCL IN DEXTROSE-NACL 20-5-0.45 MEQ/L-%-% IV SOLN
INTRAVENOUS | Status: DC
Start: 2016-01-18 — End: 2016-01-18
  Administered 2016-01-18: 03:00:00 via INTRAVENOUS

## 2016-01-18 MED ORDER — ACETAMINOPHEN 325 MG PO TABS
ORAL_TABLET | ORAL | Status: AC
  Filled 2016-01-18: qty 2

## 2016-01-18 MED ORDER — FENTANYL CITRATE (PF) 250 MCG/5ML IJ SOLN
INTRAMUSCULAR | Status: DC | PRN
  Administered 2016-01-18: 100 ug via INTRAVENOUS

## 2016-01-18 MED ORDER — 0.9 % SODIUM CHLORIDE (POUR BTL) OPTIME
TOPICAL | Status: DC | PRN
  Administered 2016-01-18: 1000 mL

## 2016-01-18 MED ORDER — DEXTROSE 5 % IV SOLN
1.0000 g | INTRAVENOUS | Status: DC
  Administered 2016-01-19 – 2016-01-20 (×2): 1 g via INTRAVENOUS
  Filled 2016-01-18 (×4): qty 10

## 2016-01-18 MED ORDER — SODIUM CHLORIDE 0.9 % IV BOLUS (SEPSIS)
2000.0000 mL | Freq: Once | INTRAVENOUS | Status: AC
Start: 2016-01-18 — End: 2016-01-18
  Administered 2016-01-18: 2000 mL via INTRAVENOUS

## 2016-01-18 MED ORDER — INSULIN ASPART 100 UNIT/ML ~~LOC~~ SOLN
0.0000 [IU] | Freq: Three times a day (TID) | SUBCUTANEOUS | Status: DC
  Administered 2016-01-20: 1 [IU] via SUBCUTANEOUS

## 2016-01-18 MED ORDER — OXYBUTYNIN CHLORIDE 5 MG PO TABS
5.0000 mg | ORAL_TABLET | Freq: Three times a day (TID) | ORAL | Status: DC | PRN
  Filled 2016-01-18: qty 1

## 2016-01-18 MED ORDER — SODIUM CHLORIDE 0.9 % IR SOLN
Status: DC | PRN
  Administered 2016-01-18: 3000 mL

## 2016-01-18 MED ORDER — ONDANSETRON HCL 4 MG/2ML IJ SOLN
4.0000 mg | INTRAMUSCULAR | Status: DC | PRN
  Filled 2016-01-18: qty 2

## 2016-01-18 MED ORDER — LACTATED RINGERS IV SOLN
INTRAVENOUS | Status: DC | PRN
  Administered 2016-01-18: via INTRAVENOUS

## 2016-01-18 MED ORDER — ZOLPIDEM TARTRATE 5 MG PO TABS: 5.0000 mg | ORAL_TABLET | Freq: Every evening | ORAL | Status: DC | PRN

## 2016-01-18 MED ORDER — ALBUMIN HUMAN 5 % IV SOLN
INTRAVENOUS | Status: DC | PRN
  Administered 2016-01-18 (×2): via INTRAVENOUS

## 2016-01-18 MED ORDER — ENOXAPARIN SODIUM 40 MG/0.4ML ~~LOC~~ SOLN
40.0000 mg | SUBCUTANEOUS | Status: DC
  Administered 2016-01-18 – 2016-01-19 (×2): 40 mg via SUBCUTANEOUS
  Filled 2016-01-18 (×2): qty 0.4

## 2016-01-18 MED ORDER — HYDROCODONE-ACETAMINOPHEN 5-325 MG PO TABS: 1.0000 | ORAL_TABLET | ORAL | Status: DC | PRN

## 2016-01-18 MED ORDER — HYDROMORPHONE HCL 1 MG/ML IJ SOLN: 0.5000 mg | INTRAMUSCULAR | Status: DC | PRN

## 2016-01-18 MED ORDER — DIPHENHYDRAMINE HCL 12.5 MG/5ML PO ELIX: 12.5000 mg | ORAL_SOLUTION | Freq: Four times a day (QID) | ORAL | Status: DC | PRN

## 2016-01-18 MED ORDER — DEXTROSE 5 % IV SOLN
1.0000 g | Freq: Once | INTRAVENOUS | Status: AC
  Administered 2016-01-18: 1 g via INTRAVENOUS

## 2016-01-18 MED ORDER — SODIUM CHLORIDE 0.9 % IV BOLUS (SEPSIS)
1000.0000 mL | Freq: Once | INTRAVENOUS | Status: AC
  Administered 2016-01-18: 1000 mL via INTRAVENOUS

## 2016-01-18 MED ORDER — FENTANYL CITRATE (PF) 100 MCG/2ML IJ SOLN: 25.0000 ug | INTRAMUSCULAR | Status: DC | PRN

## 2016-01-18 MED ORDER — KCL IN DEXTROSE-NACL 20-5-0.45 MEQ/L-%-% IV SOLN
INTRAVENOUS | Status: AC
  Filled 2016-01-18: qty 1000

## 2016-01-18 MED ORDER — MAGNESIUM HYDROXIDE 400 MG/5ML PO SUSP
30.0000 mL | Freq: Every day | ORAL | Status: DC | PRN
  Filled 2016-01-18: qty 30

## 2016-01-18 MED ORDER — ALBUMIN HUMAN 5 % IV SOLN
12.5000 g | Freq: Once | INTRAVENOUS | Status: AC
  Administered 2016-01-18: 12.5 g via INTRAVENOUS

## 2016-01-18 MED ORDER — PHENYLEPHRINE HCL 10 MG/ML IJ SOLN
INTRAMUSCULAR | Status: DC | PRN
  Administered 2016-01-18: 40 ug via INTRAVENOUS
  Administered 2016-01-18 (×2): 80 ug via INTRAVENOUS

## 2016-01-18 MED ORDER — DIPHENHYDRAMINE HCL 50 MG/ML IJ SOLN: 12.5000 mg | Freq: Four times a day (QID) | INTRAMUSCULAR | Status: DC | PRN

## 2016-01-18 MED ORDER — BISACODYL 10 MG RE SUPP: 10.0000 mg | Freq: Every day | RECTAL | Status: DC | PRN

## 2016-01-18 NOTE — Progress Notes (Signed)
PT IS ON NIV at this time tolerating it well.

## 2016-01-18 NOTE — Transfer of Care (Signed)
Immediate Anesthesia Transfer of Care Note  Patient: Dominique Mcdonald  Procedure(s) Performed: Procedure(s): CYSTOSCOPY WITH RETROGRADE PYELOGRAM/URETERAL STENT PLACEMENT (Left)  Patient Location: PACU  Anesthesia Type:General  Level of Consciousness: sedated  Airway & Oxygen Therapy: Patient connected to face mask oxygen  Post-op Assessment: Report given to RN and Post -op Vital signs reviewed and stable  Post vital signs: Reviewed and stable  Last Vitals:  Filed Vitals:   01/18/16 0000 01/18/16 0021  BP: 107/59   Pulse: 38   Temp:  37.8 C  Resp: 18     Last Pain:  Filed Vitals:   01/18/16 0138  PainSc: 2          Complications: No apparent anesthesia complications

## 2016-01-18 NOTE — Progress Notes (Signed)
[facility] Progress Note Patient Name: Dominique Mcdonald DOB: 03-08-1941 MRN: [id number]   Date of Service  01/18/2016  HPI/Events of Note  Hypotension - BP = 92/45. Patient is not currently a PCCM patient.   eICU Interventions  Will order: 1. 0.9 NaCl 1 liter IV over 1 hour now.   Defer further management to primary service.     Intervention Category Major Interventions: Hypotension - evaluation and management  Sommer,Steven Eugene 01/18/2016, 3:26 AM

## 2016-01-18 NOTE — Progress Notes (Signed)
CRITICAL VALUE ALERT  Critical value received:  Lactic acid 3.5  Date of notification:  01/18/2016  Time of notification:  0525   Critical value read back:Yes.    Nurse who received alert:  Fara Chute  MD notified (1st page):  MD Melburn Hake   Time of first page:  [phone]  MD notified (2nd page):  Time of second page:  Responding MD:  -  Time MD responded:  -

## 2016-01-18 NOTE — Progress Notes (Signed)
Critical Lactic Acid 3.5. Notified MD Melburn Hake. No new orders given. RN will continue to monitor.

## 2016-01-18 NOTE — Anesthesia Procedure Notes (Signed)
Procedure Name: Intubation Date/Time: 01/18/2016 12:41 AM Performed by: Valetta Fuller Pre-anesthesia Checklist: Patient identified, Emergency Drugs available, Suction available and Patient being monitored Patient Re-evaluated:Patient Re-evaluated prior to inductionOxygen Delivery Method: Circle system utilized Preoxygenation: Pre-oxygenation with 100% oxygen Intubation Type: IV induction, Rapid sequence and Cricoid Pressure applied Laryngoscope Size: Mac and 3 Grade View: Grade I Tube type: Oral Tube size: 7.5 mm Number of attempts: 1 Airway Equipment and Method: Stylet Placement Confirmation: ETT inserted through vocal cords under direct vision,  positive ETCO2 and breath sounds checked- equal and bilateral Secured at: 21 cm Tube secured with: Tape Dental Injury: Teeth and Oropharynx as per pre-operative assessment

## 2016-01-18 NOTE — Progress Notes (Signed)
Patient is refusing to wear CPAP HS. She stated she tried it lastnight and did not like it, that the pressure was to much, and once I told the patient we could put it on auto titrating, she stated that it was too cold when she wore it, that hers has a heater on it. Patient instructed to call if she changed her mind. Nurse aware.

## 2016-01-18 NOTE — H&P (Signed)
Date: 01/18/2016               Patient Name:  Dominique Mcdonald MRN: [id number]  DOB: 03-26-1941 Age / Sex: 75 y.o., female   PCP: No Pcp Per Patient         Medical Service: Internal Medicine Teaching Service         Attending Physician: Dr. Sid Falcon, MD    First Contact: Dr. Lindon Romp Pager: [phone]  Second Contact: Dr. Charlott Rakes Pager: [phone]       After Hours (After 5p/  First Contact Pager: [phone]  weekends / holidays): Second Contact Pager: [phone]   Chief Complaint: "My back hurts."  History of Present Illness:  Dominique Mcdonald is a 75 year old lady with primary biliary cirrhosis, hypertension, breast cancer status-post chemotherapy and radiation, and known nephrolithiasis here with left-sided flank pain.  She knew she had a left-sided kidney stone and this was being followed by her urologist in [place] where she lives. She had just taken a trip to [place] for her [employer] graduation last week. She flew back to [place] to visit her son, who lives here, and was feeling well until this morning when she began feeling severe left flank pain that radiated into her groin, along with dysuria and subjective fevers. She denied any hematuria, change in her bowel movements, or prior history of urolithiasis; review of systems was otherwise non-revealing.  Meds: Current Facility-Administered Medications  Medication Dose Route Frequency Provider Last Rate Last Dose  . acetaminophen (TYLENOL) 325 MG tablet           . acetaminophen (TYLENOL) tablet 650 mg  650 mg Oral Q4H PRN Irine Seal, MD   650 mg at 01/18/16 0247  . albumin human 5 % solution           . bisacodyl (DULCOLAX) suppository 10 mg  10 mg Rectal Daily PRN Irine Seal, MD      . Derrill Memo ON 01/19/2016] cefTRIAXone (ROCEPHIN) 1 g in dextrose 5 % 50 mL IVPB  1 g Intravenous Q24H Irine Seal, MD      . dextrose 5 % and 0.45 % NaCl with KCl 20 mEq/L infusion   Intravenous Continuous Irine Seal, MD 100 mL/hr at 01/18/16 0248    .  diphenhydrAMINE (BENADRYL) injection 12.5 mg  12.5 mg Intravenous Q6H PRN Irine Seal, MD       Or  . diphenhydrAMINE (BENADRYL) 12.5 MG/5ML elixir 12.5 mg  12.5 mg Oral Q6H PRN Irine Seal, MD      . enoxaparin (LOVENOX) injection 40 mg  40 mg Subcutaneous Q24H Irine Seal, MD      . HYDROcodone-acetaminophen (NORCO/VICODIN) 5-325 MG per tablet 1-2 tablet  1-2 tablet Oral Q4H PRN Irine Seal, MD      . HYDROmorphone (DILAUDID) injection 0.5-1 mg  0.5-1 mg Intravenous Q2H PRN Irine Seal, MD      . magnesium hydroxide (MILK OF MAGNESIA) suspension 30 mL  30 mL Oral Daily PRN Irine Seal, MD      . ondansetron [facility]) injection 4 mg  4 mg Intravenous Q4H PRN Irine Seal, MD      . oxybutynin (DITROPAN) tablet 5 mg  5 mg Oral Q8H PRN Irine Seal, MD      . sodium chloride 0.9 % bolus 1,000 mL  1,000 mL Intravenous Once Anders Simmonds, MD   1,000 mL at 01/18/16 0331  . sodium phosphate (FLEET) 7-19 GM/118ML enema 1 enema  1 enema Rectal  Once PRN Irine Seal, MD      . zolpidem [facility]) tablet 5 mg  5 mg Oral QHS PRN Irine Seal, MD        Allergies: Allergies as of 01/17/2016 - Review Complete 01/17/2016  Allergen Reaction Noted  . Penicillins Other (See Comments) 01/17/2016  . Sulfa antibiotics Rash 01/17/2016   Past Medical History  Diagnosis Date  . Biliary cirrhosis ([place])   . Cholelithiasis   . Nephrolithiasis   . Cancer (H[facility])     Breast  . Asthma   . Hypertension    Past Surgical History  Procedure Laterality Date  . Cataract extraction    . Replacement total knee    . Breast lumpectomy     Family History  Problem Relation Age of Onset  . Lung cancer Mother    Social History   Social History  . Marital Status: Married    Spouse Name: N/A  . Number of Children: N/A  . Years of Education: N/A   Occupational History  . Not on file.   Social History Main Topics  . Smoking status: Never Smoker   . Smokeless tobacco: Not on file  . Alcohol Use: No  . Drug Use: No  . Sexual  Activity: Not on file   Other Topics Concern  . Not on file   Social History Narrative   Review of Systems: Per HPI  Physical Exam: Blood pressure 92/45, pulse 86, temperature 98.5 F (36.9 C), temperature source Oral, resp. rate 25, SpO2 91 %. General: obese lethargic lady resting in bed comfortably, appropriately conversational HEENT: no scleral icterus, extra-ocular muscles intact, oropharynx without lesions Cardiac: regular rate and rhythm, no rubs, murmurs or gallops Pulm: breathing well, bibasilar crackles Abd: bowel sounds normal, soft, nondistended, non-tender, no CVA tenderness Ext: warm and well perfused, with trace pedal edema Lymph: no cervical or supraclavicular lymphadenopathy Skin: no rash, hair, or nail changes Neuro: alert and oriented X3, cranial nerves II-XII grossly intact, moving all extremities well  Lab results: Basic Metabolic Panel:  Recent Labs  01/17/16 1939  NA 136  K 4.7  CL 102  CO2 24  GLUCOSE 127*  BUN 14  CREATININE 0.99  CALCIUM 9.3   Liver Function Tests:  Recent Labs  01/17/16 1939  AST 46*  ALT 23  ALKPHOS 63  BILITOT 1.4*  PROT 7.2  ALBUMIN 3.7   CBC:  Recent Labs  01/17/16 1939  WBC 10.8*  HGB 12.3  HCT 38.6  MCV 87.5  PLT 285   Urinalysis:  Recent Labs  01/17/16 2057  COLORURINE YELLOW  LABSPEC 1.020  PHURINE 6.0  GLUCOSEU NEGATIVE  HGBUR MODERATE*  BILIRUBINUR NEGATIVE  KETONESUR NEGATIVE  PROTEINUR NEGATIVE  NITRITE NEGATIVE  LEUKOCYTESUR MODERATE*   Imaging results:  Ct Abdomen Pelvis W Contrast  01/17/2016  CLINICAL DATA:  Left lower quadrant abdominal pain and left flank pain beginning and Ing at today. EXAM: CT ABDOMEN AND PELVIS WITH CONTRAST TECHNIQUE: Multidetector CT imaging of the abdomen and pelvis was performed using the standard protocol following bolus administration of intravenous contrast. CONTRAST:  1 ISOVUE-300 IOPAMIDOL (ISOVUE-300) INJECTION 61% COMPARISON:  None. FINDINGS:  Lower chest: The lung bases demonstrate some chronic change. No focal airspace disease is present. Heart size is normal. Coronary artery calcifications are present. There also calcifications of the mitral valve. Hepatobiliary: An 11 mm lesion in the lateral aspect of the right lobe is compatible with a simple cyst. No other focal lesions are present. [place]  mm stone is present at the neck of the gallbladder. There is no inflammatory change. The common bile duct is within normal limits. Pancreas: The a 2.1 cm low-density cysts is present in the tail the pancreas. No other discrete lesions are present. The pancreatic duct is within normal limits. Spleen: A single punctate calcification is present in the central aspect of the splaying. No other focal lesions are present. Adrenals/Urinary Tract: The adrenal glands are normal bilaterally. A 12 mm benign appearing cysts is present at the upper pole of the right kidney. A second 12 mm cyst is present at the lower pole of the right kidney. Moderate left-sided hydronephrosis is present. An obstructing 12 mm stone is present at the left UPJ. The more distal ureters are within normal limits bilaterally. No additional stones are present. The urinary bladder is mostly collapsed. Stomach/Bowel: A moderate-sized hiatal hernia is present. The stomach is otherwise unremarkable. The duodenum is within normal limits. Small bowel is within normal limits is well. The appendix is visualized and normal. The cecum is unremarkable. The ascending transverse colon are within normal limits. The descending rectosigmoid colon are unremarkable. Vascular/Lymphatic: Extensive atherosclerotic calcifications are present in the aorta and branch vessels. There is no aneurysm. Reproductive: Uterus and adnexa are within normal limits for age. Other: No significant free fluid is present. Musculoskeletal: A vacuum disc is present at L5-S1. Multilevel facet degenerative changes are present in the lower lumbar  spine. By vacuum discs in the lower thoracic spine is well. Vertebral body heights and alignment are maintained. The pelvis is intact. IMPRESSION: 1. Obstructing 12 mm stone at the left UPJ with moderate left-sided hydronephrosis. 2. Benign-appearing simple cysts at the upper and lower pole of the right kidney. 3. No additional nephrolithiasis. 4. Benign-appearing low-density lesion at the tail the pancreas likely represents a simple cyst. Recommend MRI of the pancreas with MRCP This recommendation follows ACR consensus guidelines: Managing Incidental Findings on Abdominal CT: White Paper of the ACR Incidental Findings Committee. J Am Coll Radiol 2010;7:754-773. 5. Moderate-sized hiatal hernia. 6. Extensive atherosclerotic disease including coronary artery disease. Electronically Signed   By: San Morelle M.D.   On: 01/17/2016 21:27   Dg Cystogram  01/18/2016  CLINICAL DATA:  Cystoscopy.  Left UPJ stone on CT. EXAM: CYSTOGRAM - 3+ VIEW; DG C-ARM 61-120 MIN FLUOROSCOPY TIME:  Radiation Exposure Index (as provided by the fluoroscopic device): Not provided. If the device does not provide the exposure index: Fluoroscopy Time (in minutes and seconds):  9 seconds Number of Acquired Images:  1 COMPARISON:  CT abdomen and pelvis 01/17/2016 FINDINGS: A single spot fluoroscopic image of the left kidney is obtained during cystoscopy. The image demonstrates contrast material in the left intrarenal collecting system with a pigtail catheter demonstrated in the renal pelvis and extending along the visualized ureter. Calices do not appear blunted. No discrete filling defects are appreciated. IMPRESSION: Spot fluoroscopic image obtained during cystoscopy demonstrates contrast material and pigtail catheter in the left renal pelvis. Electronically Signed   By: Lucienne Capers M.D.   On: 01/18/2016 02:31   Dg C-arm 1-60 Min  01/18/2016  CLINICAL DATA:  Cystoscopy.  Left UPJ stone on CT. EXAM: CYSTOGRAM - 3+ VIEW; DG  C-ARM 61-120 MIN FLUOROSCOPY TIME:  Radiation Exposure Index (as provided by the fluoroscopic device): Not provided. If the device does not provide the exposure index: Fluoroscopy Time (in minutes and seconds):  9 seconds Number of Acquired Images:  1 COMPARISON:  CT abdomen and pelvis  01/17/2016 FINDINGS: A single spot fluoroscopic image of the left kidney is obtained during cystoscopy. The image demonstrates contrast material in the left intrarenal collecting system with a pigtail catheter demonstrated in the renal pelvis and extending along the visualized ureter. Calices do not appear blunted. No discrete filling defects are appreciated. IMPRESSION: Spot fluoroscopic image obtained during cystoscopy demonstrates contrast material and pigtail catheter in the left renal pelvis. Electronically Signed   By: Lucienne Capers M.D.   On: 01/18/2016 02:31    Assessment & Plan by Problem:  Dominique Mcdonald is a 75 year old lady with primary biliary cirrhosis, hypertension, and known nephrolithiasis here with left-sided ureteropelvic urolithiasis with pyelonephritis.  Left-sided ureteropelvic urolithiasis with pyelonephritis: Urology placed a double J stent in the operating room; during the procedure, there was frank pus draining from the renal pelvis. When the stent was placed, the stone moved proximally back into the renal pelvis. She may need this definitively removed in the next 1-3 weeks. Her pain has resolved and her vitals are normal except for some mild hypotension that I suspect is related to the anesthesia. We will continue IV ceftriaxone empirically and follow her urine cultures. -Continue IV ceftriaxone -Follow urine cultures -Continue 1-2 tabs Norco 5-325 every 4 hours as needed for pain -Bolus to keep MAP>65  Incidental lesion on the pancreatic head: This was seen on her abdominal CT and was favored to be benign, but the radiologist recommended MRI with MRCP to confirm the benign nature. She does have  a 60 pack year smoking history and she has a personal history of breast cancer, but she denies weight loss and her LFTs are non-obstructive. -Will need outpatient MRI and MRCP upon discharge  Primary biliary cirrhosis: Her LFTs appear normal and she does not have overt ascites on exam nor seen on abdominal CT scanning. -Continue diphenhydramine 12.5mg  every 6 hours as needd for itching  Hypertension: She is normally on lisinopril (dosage unknown). We will hold for now. -Holding lisinopril  Obstructive sleep apnea: Not an acute issue. -We will resume her home CPAP.  Dispo: Disposition is deferred at this time, awaiting improvement of current medical problems. Anticipated discharge in approximately 1-4 day(s).   The patient does have a current PCP (in [place]) and does need an [facility] hospital follow-up appointment after discharge.  The patient does not know have transportation limitations that hinder transportation to clinic appointments.  Signed: Loleta Chance, MD 01/18/2016, 3:49 AM

## 2016-01-18 NOTE — Op Note (Signed)
Dominique Mcdonald, Dominique Mcdonald             ACCOUNT NO.:  [id number]  MEDICAL RECORD NO.:  [id number]  LOCATION:  [facility]                         FACILITY:  [facility]  PHYSICIAN:  Marshall Cork. Jeffie Pollock, M.D.    DATE OF BIRTH:  26-May-1941  DATE OF PROCEDURE:  01/18/2016 DATE OF DISCHARGE:                              OPERATIVE REPORT   PROCEDURE:  Cystoscopy with insertion of right double-J stent.  PREOPERATIVE DIAGNOSIS:  Right ureteropelvic junction stone with sepsis from a urinary origin.  POSTOPERATIVE DIAGNOSIS:  Right ureteropelvic junction stone with sepsis from a urinary origin.  SURGEON:  Marshall Cork. Jeffie Pollock, MD  ANESTHESIA:  General.  SPECIMEN:  None.  DRAINS:  A 6-French x 24 cm left Contour double-J stent.  BLOOD LOSS:  None.  COMPLICATIONS:  None.  INDICATIONS:  Dominique Mcdonald is a 75 year old, white female who presented to the emergency room with left flank pain and fever of 104 and a 12 mm left UPJ stone.  After reviewing the films, it was felt that stenting was indicated.  FINDINGS OF PROCEDURE:  She had been given Rocephin in the emergency room.  She was taken to the OR where a general anesthetic was induced and she was placed in lithotomy position.  Her perineum and genitalia were prepped with Betadine solution and she was draped in usual sterile fashion.  Cystoscopy was performed using the 22-French scope and 12 degree lens. Examination revealed a normal urethra.  The bladder wall had mild trabeculation.  There was diffuse changes consistent with follicular cystitis.  No tumors or stones were noted.  The right ureteral orifice was unremarkable.  The left ureteral orifice was unremarkable.  The left ureteral orifice was cannulated with 5-French open-end catheter followed by a Sensor guidewire which was passed to the kidney without difficulty, it was not entirely clear.  The stone appeared to push back into the kidney, possibly the lower pole.  Once the guidewire was in position,  the collecting system again to fill with contrast and purulent urine was noted to efflux along the wire.  A 6-French 24 cm Contour double-J stent was then inserted over the wire to the kidney under fluoroscopic guidance.  The wire was removed leaving good coil in the kidney and a good coil in the bladder.  The distal stent loop was observed and purulent urine was noted to efflux through the stent holes.  At this point, the bladder was drained.  The patient was taken down from lithotomy position.  Her anesthetic was reversed and she was moved to recovery room in stable condition.  She will be admitted to the Hospitalist Service for management of her sepsis.  She will need definitive stone therapy in the next 1-3 weeks.  She will be going back to [place] where she is spending the summer with her husband.     Marshall Cork. Jeffie Pollock, M.D.     JJW/MEDQ  D:  01/18/2016  T:  01/18/2016  Job:  [id number]

## 2016-01-18 NOTE — Brief Op Note (Signed)
01/17/2016  12:27 AM  PATIENT:  Dominique Mcdonald  75 y.o. female  PRE-OPERATIVE DIAGNOSIS:  Left Ureteral Stone with sepsis  POST-OPERATIVE DIAGNOSIS: Left Ureteral stone with sepsis  PROCEDURE:  Procedure(s): CYSTOSCOPY WITH URETERAL STENT PLACEMENT (Left)  SURGEON:  Surgeon(s) and Role:    * Irine Seal, MD - Primary  PHYSICIAN ASSISTANT:   ASSISTANTS: none   ANESTHESIA:   general  EBL:     BLOOD ADMINISTERED:none  DRAINS: 6 x 24 left contour JJ stent   LOCAL MEDICATIONS USED:  NONE  SPECIMEN:  No Specimen  DISPOSITION OF SPECIMEN:  N/A  COUNTS:  YES  TOURNIQUET:  * No tourniquets in log *  DICTATION: .Other Dictation: Dictation Number [id number]   PLAN OF CARE: Admit for overnight observation  PATIENT DISPOSITION:  PACU - guarded condition.   Delay start of Pharmacological VTE agent (>24hrs) due to surgical blood loss or risk of bleeding: not applicable

## 2016-01-18 NOTE — Progress Notes (Signed)
When pt arrived on unit, BP 82/40. MD summers gave verbal orders for 1,045mL NS bolus. Administering now. RN will continue to monitor.

## 2016-01-18 NOTE — Progress Notes (Signed)
Subjective: Dominique Mcdonald.  Patient remained hemodynamically stable, though BPs soft. She has been afebrile overnight.  She has urinary urgency, but reports recent urination.  She denies CP, SOB, dizziness, or lightheadedness.  She was minimally lightheaded when rising to sitting position in bed.  Objective: Vital signs in last 24 hours: Filed Vitals:   01/18/16 0316 01/18/16 0319 01/18/16 0426 01/18/16 0505  BP: 82/40  85/35 87/42  Pulse:  83 86 88  Temp: 98.5 F (36.9 C)     TempSrc: Oral     Resp:  21 22 18   SpO2:  91% 92% 91%   Weight change:   Intake/Output Summary (Last 24 hours) at 01/18/16 0645 Last data filed at 01/18/16 0248  Gross per 24 hour  Intake   1200 ml  Output     40 ml  Net   1160 ml   Physical Exam  Constitutional: She is oriented to person, place, and time and well-developed, well-nourished, and in no distress. No distress.  HENT:  Head: Normocephalic and atraumatic.  Eyes: EOM are normal. No scleral icterus.  Neck: No tracheal deviation present.  Cardiovascular: Normal rate, regular rhythm, normal heart sounds and intact distal pulses.   Pulmonary/Chest: Effort normal. No stridor. No respiratory distress. She has no wheezes.  Moderate crackles in bilateral bases.  Abdominal: Soft. She exhibits no distension. There is no tenderness. There is no rebound and no guarding.  Musculoskeletal: She exhibits no edema.  Neurological: She is alert and oriented to person, place, and time.  Skin: Skin is warm and dry. She is not diaphoretic.    Lab Results: Basic Metabolic Panel:  Recent Labs Lab 01/17/16 1939 01/18/16 0424  NA 136 140  K 4.7 3.1*  CL 102 108  CO2 24 23  GLUCOSE 127* 97  BUN 14 17  CREATININE 0.99 1.45*  CALCIUM 9.3 7.9*   Liver Function Tests:  Recent Labs Lab 01/17/16 1939  AST 46*  ALT 23  ALKPHOS 63  BILITOT 1.4*  PROT 7.2  ALBUMIN 3.7   No results for input(s): LIPASE, AMYLASE in the last 168 hours. No results for  input(s): AMMONIA in the last 168 hours. CBC:  Recent Labs Lab 01/17/16 1939 01/18/16 0424  WBC 10.8* 13.4*  HGB 12.3 9.5*  HCT 38.6 30.7*  MCV 87.5 88.7  PLT 285 185   Cardiac Enzymes: No results for input(s): CKTOTAL, CKMB, CKMBINDEX, TROPONINI in the last 168 hours. BNP: No results for input(s): PROBNP in the last 168 hours. D-Dimer: No results for input(s): DDIMER in the last 168 hours. CBG: No results for input(s): GLUCAP in the last 168 hours. Hemoglobin A1C: No results for input(s): HGBA1C in the last 168 hours. Fasting Lipid Panel: No results for input(s): CHOL, HDL, LDLCALC, TRIG, CHOLHDL, LDLDIRECT in the last 168 hours. Thyroid Function Tests: No results for input(s): TSH, T4TOTAL, FREET4, T3FREE, THYROIDAB in the last 168 hours. Coagulation: No results for input(s): LABPROT, INR in the last 168 hours. Anemia Panel: No results for input(s): VITAMINB12, FOLATE, FERRITIN, TIBC, IRON, RETICCTPCT in the last 168 hours. Urine Drug Screen: Drugs of Abuse  No results found for: LABOPIA, COCAINSCRNUR, LABBENZ, AMPHETMU, THCU, LABBARB  Alcohol Level: No results for input(s): ETH in the last 168 hours. Urinalysis:  Recent Labs Lab 01/17/16 2057  COLORURINE YELLOW  LABSPEC 1.020  PHURINE 6.0  GLUCOSEU NEGATIVE  HGBUR MODERATE*  BILIRUBINUR NEGATIVE  KETONESUR NEGATIVE  PROTEINUR NEGATIVE  NITRITE NEGATIVE  LEUKOCYTESUR MODERATE*   Misc. Labs:  Micro Results: Recent Results (from the past 240 hour(s))  Blood culture (routine x 2)     Status: None (Preliminary result)   Collection Time: 01/17/16 11:01 PM  Result Value Ref Range Status   Specimen Description BLOOD LEFT HAND  Final   Special Requests BOTTLES DRAWN AEROBIC AND ANAEROBIC 5CC EA  Final   Culture PENDING  Incomplete   Report Status PENDING  Incomplete   Studies/Results: Ct Abdomen Pelvis W Contrast  01/17/2016  CLINICAL DATA:  Left lower quadrant abdominal pain and left flank pain beginning  and Ing at today. EXAM: CT ABDOMEN AND PELVIS WITH CONTRAST TECHNIQUE: Multidetector CT imaging of the abdomen and pelvis was performed using the standard protocol following bolus administration of intravenous contrast. CONTRAST:  1 ISOVUE-300 IOPAMIDOL (ISOVUE-300) INJECTION 61% COMPARISON:  None. FINDINGS: Lower chest: The lung bases demonstrate some chronic change. No focal airspace disease is present. Heart size is normal. Coronary artery calcifications are present. There also calcifications of the mitral valve. Hepatobiliary: An 11 mm lesion in the lateral aspect of the right lobe is compatible with a simple cyst. No other focal lesions are present. 11 mm stone is present at the neck of the gallbladder. There is no inflammatory change. The common bile duct is within normal limits. Pancreas: The a 2.1 cm low-density cysts is present in the tail the pancreas. No other discrete lesions are present. The pancreatic duct is within normal limits. Spleen: A single punctate calcification is present in the central aspect of the splaying. No other focal lesions are present. Adrenals/Urinary Tract: The adrenal glands are normal bilaterally. A 12 mm benign appearing cysts is present at the upper pole of the right kidney. A second 12 mm cyst is present at the lower pole of the right kidney. Moderate left-sided hydronephrosis is present. An obstructing 12 mm stone is present at the left UPJ. The more distal ureters are within normal limits bilaterally. No additional stones are present. The urinary bladder is mostly collapsed. Stomach/Bowel: A moderate-sized hiatal hernia is present. The stomach is otherwise unremarkable. The duodenum is within normal limits. Small bowel is within normal limits is well. The appendix is visualized and normal. The cecum is unremarkable. The ascending transverse colon are within normal limits. The descending rectosigmoid colon are unremarkable. Vascular/Lymphatic: Extensive atherosclerotic  calcifications are present in the aorta and branch vessels. There is no aneurysm. Reproductive: Uterus and adnexa are within normal limits for age. Other: No significant free fluid is present. Musculoskeletal: A vacuum disc is present at L5-S1. Multilevel facet degenerative changes are present in the lower lumbar spine. By vacuum discs in the lower thoracic spine is well. Vertebral body heights and alignment are maintained. The pelvis is intact. IMPRESSION: 1. Obstructing 12 mm stone at the left UPJ with moderate left-sided hydronephrosis. 2. Benign-appearing simple cysts at the upper and lower pole of the right kidney. 3. No additional nephrolithiasis. 4. Benign-appearing low-density lesion at the tail the pancreas likely represents a simple cyst. Recommend MRI of the pancreas with MRCP This recommendation follows ACR consensus guidelines: Managing Incidental Findings on Abdominal CT: White Paper of the ACR Incidental Findings Committee. J Am Coll Radiol 2010;7:754-773. 5. Moderate-sized hiatal hernia. 6. Extensive atherosclerotic disease including coronary artery disease. Electronically Signed   By: San Morelle M.D.   On: 01/17/2016 21:27   Dg Cystogram  01/18/2016  CLINICAL DATA:  Cystoscopy.  Left UPJ stone on CT. EXAM: CYSTOGRAM - 3+ VIEW; DG C-ARM 61-120 MIN FLUOROSCOPY TIME:  Radiation  Exposure Index (as provided by the fluoroscopic device): Not provided. If the device does not provide the exposure index: Fluoroscopy Time (in minutes and seconds):  9 seconds Number of Acquired Images:  1 COMPARISON:  CT abdomen and pelvis 01/17/2016 FINDINGS: A single spot fluoroscopic image of the left kidney is obtained during cystoscopy. The image demonstrates contrast material in the left intrarenal collecting system with a pigtail catheter demonstrated in the renal pelvis and extending along the visualized ureter. Calices do not appear blunted. No discrete filling defects are appreciated. IMPRESSION: Spot  fluoroscopic image obtained during cystoscopy demonstrates contrast material and pigtail catheter in the left renal pelvis. Electronically Signed   By: Lucienne Capers M.D.   On: 01/18/2016 02:31   Dg C-arm 1-60 Min  01/18/2016  CLINICAL DATA:  Cystoscopy.  Left UPJ stone on CT. EXAM: CYSTOGRAM - 3+ VIEW; DG C-ARM 61-120 MIN FLUOROSCOPY TIME:  Radiation Exposure Index (as provided by the fluoroscopic device): Not provided. If the device does not provide the exposure index: Fluoroscopy Time (in minutes and seconds):  9 seconds Number of Acquired Images:  1 COMPARISON:  CT abdomen and pelvis 01/17/2016 FINDINGS: A single spot fluoroscopic image of the left kidney is obtained during cystoscopy. The image demonstrates contrast material in the left intrarenal collecting system with a pigtail catheter demonstrated in the renal pelvis and extending along the visualized ureter. Calices do not appear blunted. No discrete filling defects are appreciated. IMPRESSION: Spot fluoroscopic image obtained during cystoscopy demonstrates contrast material and pigtail catheter in the left renal pelvis. Electronically Signed   By: Lucienne Capers M.D.   On: 01/18/2016 02:31   Medications: I have reviewed the patient's current medications. Scheduled Meds: . acetaminophen      . albumin human      . [START ON 01/19/2016] cefTRIAXone (ROCEPHIN)  IV  1 g Intravenous Q24H  . enoxaparin (LOVENOX) injection  40 mg Subcutaneous Q24H   Continuous Infusions: . dextrose 5 % and 0.45 % NaCl with KCl 20 mEq/L 100 mL/hr at 01/18/16 0248   PRN Meds:.acetaminophen, bisacodyl, diphenhydrAMINE **OR** diphenhydrAMINE, HYDROcodone-acetaminophen, HYDROmorphone (DILAUDID) injection, magnesium hydroxide, ondansetron, oxybutynin, sodium phosphate, zolpidem Assessment/Plan: Principal Problem:   Left ureteral stone Active Problems:   Sepsis due to urinary tract infection ([place])   Cystitis  Ms. Humes is a 75 year old lady with primary  biliary cirrhosis, hypertension, and known nephrolithiasis here with left-sided ureteropelvic urolithiasis with pyelonephritis.  Left UPJ Urolithiasis with Pyelonephritis s/p Ureteral Stent: Urology placed a double J stent during cystoscopy with visualization of frank pus draining from the renal pelvis. When the stent was placed, the stone moved proximally back into the renal pelvis. She will need this definitively removed in the next 1-3 weeks. Her pain has resolved. We will continue IV ceftriaxone empirically and follow her urine cultures. - CTX IV [ ]  UCx - Norco 5-325 1-2 tabs q4h PRN Pain  Hypotension: Patient's BPs currently stable at 80s/40s.  She is asymptomatic.  States her BPs at home usually 130/70.  Lactate is slightly elevated, but she has moderate pulmonary crackles, so I am hesitant to provide too much IVF.  Patient is awake, alert, and asymptomatic, so we will continue to monitor. Will watch UOP to ensure there is enough cardiac output to perfuse the kidneys.  As patient does not appear septic, we hope her pressures improve as the sedation wears off. [ ]  Trend lactate - Insert Foley - Strict I/O  Incidental lesion on the pancreatic head:  This was seen on her abdominal CT and was favored to be benign, but the radiologist recommended MRI with MRCP to confirm the benign nature. She does have a 60 pack year smoking history and she has a personal history of breast cancer, but she denies weight loss and her LFTs are non-obstructive. - Outpatient MRI and MRCP upon discharge  Primary biliary cirrhosis: Her LFTs appear normal and she does not have overt ascites on exam or CT - Diphenhydramine 12.5 mg q6h PRN itching  Hypertension: She is normally on lisinopril (dosage unknown). We will hold for now. -HOLD Lisinopril  OSA: Not an acute issue. - CPAP.  Dispo: Disposition is deferred at this time, awaiting improvement of current medical problems.  Anticipated discharge in approximately  2-3 day(s).   The patient does have a current PCP (No Pcp Per Patient) and does need an [facility] hospital follow-up appointment after discharge.  The patient does not have transportation limitations that hinder transportation to clinic appointments.  .Services Needed at time of discharge: Y = Yes, Blank = No PT:   OT:   RN:   Equipment:   Other:     LOS: 0 days   Iline Oven, MD 01/18/2016, 6:45 AM

## 2016-01-18 NOTE — ED Notes (Signed)
Taken to short stay at this time.

## 2016-01-18 NOTE — Discharge Instructions (Addendum)
Please call your urologist back in [place] about continuing on [place] and oxybutynin.   Don't forget to see a urologist when you go to [place] as well.   CYSTOSCOPY HOME CARE INSTRUCTIONS  Activity: Rest for the remainder of the day.  Do not drive or operate equipment today.  You may resume normal activities in one to two days as instructed by your physician.   Meals: Drink plenty of liquids and eat light foods such as gelatin or soup this evening.  You may return to a normal meal plan tomorrow.  Return to Work: You may return to work in one to two days or as instructed by your physician.  Special Instructions / Symptoms: Call your physician if any of these symptoms occur:   -persistent or heavy bleeding  -bleeding which continues after first few urination  -large blood clots that are difficult to pass  -urine stream diminishes or stops completely  -fever equal to or higher than 101 degrees Farenheit.  -cloudy urine with a strong, foul odor  -severe pain  Females should always wipe from front to back after elimination.  You may feel some burning pain when you urinate.  This should disappear with time.  Applying moist heat to the lower abdomen or a hot tub bath may help relieve the pain. \  The stent you have in can stay for a few weeks but you need to make an appointment with a urologist in [place] to have the stone treated preferably within the next month.   Patient Signature:  ________________________________________________________  Nurse's Signature:  ________________________________________________________

## 2016-01-18 NOTE — Progress Notes (Signed)
Patient ID: Dominique Mcdonald, female   DOB: 12/08/1940, 75 y.o.   MRN: [id number] 1 Day Post-Op  Subjective: Dominique Mcdonald is POD #1 from cystoscopy and left stent insertion for a 78mm LUPJ stone with sepsis from a urinary tract infection.   She also had follicular cystitis on cystoscopy.   She is on  Rocephin pending cultures.   She is feeling better today with reduced pain and her fever has resolved.   She remains hypotensive but not tachycardic.  Her Cr is up to 1.45 but the Lactate is down to 3.5 from 3.97.   She has some mild stent irritation with urgency.  ROS:  Review of Systems  Constitutional: Negative for fever and chills.  Respiratory: Negative for shortness of breath.   Cardiovascular: Negative for chest pain and leg swelling.  Gastrointestinal: Positive for abdominal pain (mild). Negative for nausea.  Genitourinary: Positive for urgency.    Anti-infectives: Anti-infectives    Start     Dose/Rate Route Frequency Ordered Stop   01/19/16 0300  cefTRIAXone (ROCEPHIN) 1 g in dextrose 5 % 50 mL IVPB     1 g 100 mL/hr over 30 Minutes Intravenous Every 24 hours 01/18/16 0201     01/18/16 0215  cefTRIAXone (ROCEPHIN) 1 g in dextrose 5 % 50 mL IVPB     1 g 100 mL/hr over 30 Minutes Intravenous  Once 01/18/16 0203 01/18/16 0233   01/17/16 2200  cefTRIAXone (ROCEPHIN) 1 g in dextrose 5 % 50 mL IVPB     1 g 100 mL/hr over 30 Minutes Intravenous  Once 01/17/16 2154 01/17/16 2246      Current Facility-Administered Medications  Medication Dose Route Frequency Provider Last Rate Last Dose  . acetaminophen (TYLENOL) 325 MG tablet           . acetaminophen (TYLENOL) tablet 650 mg  650 mg Oral Q4H PRN Irine Seal, MD   650 mg at 01/18/16 0247  . albumin human 5 % solution           . bisacodyl (DULCOLAX) suppository 10 mg  10 mg Rectal Daily PRN Irine Seal, MD      . Derrill Memo ON 01/19/2016] cefTRIAXone (ROCEPHIN) 1 g in dextrose 5 % 50 mL IVPB  1 g Intravenous Q24H Irine Seal, MD      . dextrose  5 % and 0.45 % NaCl with KCl 20 mEq/L infusion   Intravenous Continuous Irine Seal, MD 100 mL/hr at 01/18/16 0248    . diphenhydrAMINE (BENADRYL) injection 12.5 mg  12.5 mg Intravenous Q6H PRN Irine Seal, MD       Or  . diphenhydrAMINE (BENADRYL) 12.5 MG/5ML elixir 12.5 mg  12.5 mg Oral Q6H PRN Irine Seal, MD      . enoxaparin (LOVENOX) injection 40 mg  40 mg Subcutaneous Q24H Irine Seal, MD      . HYDROcodone-acetaminophen (NORCO/VICODIN) 5-325 MG per tablet 1-2 tablet  1-2 tablet Oral Q4H PRN Irine Seal, MD      . HYDROmorphone (DILAUDID) injection 0.5-1 mg  0.5-1 mg Intravenous Q2H PRN Irine Seal, MD      . magnesium hydroxide (MILK OF MAGNESIA) suspension 30 mL  30 mL Oral Daily PRN Irine Seal, MD      . ondansetron [facility]) injection 4 mg  4 mg Intravenous Q4H PRN Irine Seal, MD      . oxybutynin (DITROPAN) tablet 5 mg  5 mg Oral Q8H PRN Irine Seal, MD      . sodium phosphate (  FLEET) 7-19 GM/118ML enema 1 enema  1 enema Rectal Once PRN Irine Seal, MD      . zolpidem (AMBIEN) tablet 5 mg  5 mg Oral QHS PRN Irine Seal, MD         Objective: Vital signs in last 24 hours: Temp:  [97.7 F (36.5 C)-104.1 F (40.1 C)] 98.5 F (36.9 C) (06/12 0316) Pulse Rate:  [38-97] 86 (06/12 0426) Resp:  [11-28] 22 (06/12 0426) BP: (76-165)/(35-103) 85/35 mmHg (06/12 0426) SpO2:  [88 %-100 %] 92 % (06/12 0426)  Intake/Output from previous day: 06/11 0701 - 06/12 0700 In: 1200 [I.V.:400; IV Piggyback:800] Out: 40 [Urine:30; Blood:10] Intake/Output this shift: Total I/O In: 1200 [I.V.:400; IV Piggyback:800] Out: 40 [Urine:30; Blood:10]   Physical Exam  Constitutional: She is oriented to person, place, and time and well-developed, well-nourished, and in no distress.  Cardiovascular: Normal rate, regular rhythm and normal heart sounds.   Pulmonary/Chest: Effort normal and breath sounds normal. No respiratory distress.  Abdominal: Soft. Bowel sounds are normal. She exhibits no distension and no  mass. There is tenderness (mild LLQ). There is no rebound and no guarding.  Musculoskeletal: Normal range of motion. She exhibits no edema.  Neurological: She is alert and oriented to person, place, and time.  Skin: Skin is warm and dry.  Psychiatric: Mood and affect normal.    Lab Results:   Recent Labs  01/17/16 1939 01/18/16 0424  WBC 10.8* 13.4*  HGB 12.3 9.5*  HCT 38.6 30.7*  PLT 285 185   BMET  Recent Labs  01/17/16 1939 01/18/16 0424  NA 136 140  K 4.7 3.1*  CL 102 108  CO2 24 23  GLUCOSE 127* 97  BUN 14 17  CREATININE 0.99 1.45*  CALCIUM 9.3 7.9*   PT/INR No results for input(s): LABPROT, INR in the last 72 hours. ABG No results for input(s): PHART, HCO3 in the last 72 hours.  Invalid input(s): PCO2, PO2  Studies/Results: Ct Abdomen Pelvis W Contrast  01/17/2016  CLINICAL DATA:  Left lower quadrant abdominal pain and left flank pain beginning and Ing at today. EXAM: CT ABDOMEN AND PELVIS WITH CONTRAST TECHNIQUE: Multidetector CT imaging of the abdomen and pelvis was performed using the standard protocol following bolus administration of intravenous contrast. CONTRAST:  1 ISOVUE-300 IOPAMIDOL (ISOVUE-300) INJECTION 61% COMPARISON:  None. FINDINGS: Lower chest: The lung bases demonstrate some chronic change. No focal airspace disease is present. Heart size is normal. Coronary artery calcifications are present. There also calcifications of the mitral valve. Hepatobiliary: An 11 mm lesion in the lateral aspect of the right lobe is compatible with a simple cyst. No other focal lesions are present. 11 mm stone is present at the neck of the gallbladder. There is no inflammatory change. The common bile duct is within normal limits. Pancreas: The a 2.1 cm low-density cysts is present in the tail the pancreas. No other discrete lesions are present. The pancreatic duct is within normal limits. Spleen: A single punctate calcification is present in the central aspect of the  splaying. No other focal lesions are present. Adrenals/Urinary Tract: The adrenal glands are normal bilaterally. A 12 mm benign appearing cysts is present at the upper pole of the right kidney. A second 12 mm cyst is present at the lower pole of the right kidney. Moderate left-sided hydronephrosis is present. An obstructing 12 mm stone is present at the left UPJ. The more distal ureters are within normal limits bilaterally. No additional stones are present. The  urinary bladder is mostly collapsed. Stomach/Bowel: A moderate-sized hiatal hernia is present. The stomach is otherwise unremarkable. The duodenum is within normal limits. Small bowel is within normal limits is well. The appendix is visualized and normal. The cecum is unremarkable. The ascending transverse colon are within normal limits. The descending rectosigmoid colon are unremarkable. Vascular/Lymphatic: Extensive atherosclerotic calcifications are present in the aorta and branch vessels. There is no aneurysm. Reproductive: Uterus and adnexa are within normal limits for age. Other: No significant free fluid is present. Musculoskeletal: A vacuum disc is present at L5-S1. Multilevel facet degenerative changes are present in the lower lumbar spine. By vacuum discs in the lower thoracic spine is well. Vertebral body heights and alignment are maintained. The pelvis is intact. IMPRESSION: 1. Obstructing 12 mm stone at the left UPJ with moderate left-sided hydronephrosis. 2. Benign-appearing simple cysts at the upper and lower pole of the right kidney. 3. No additional nephrolithiasis. 4. Benign-appearing low-density lesion at the tail the pancreas likely represents a simple cyst. Recommend MRI of the pancreas with MRCP This recommendation follows ACR consensus guidelines: Managing Incidental Findings on Abdominal CT: White Paper of the ACR Incidental Findings Committee. J Am Coll Radiol 2010;7:754-773. 5. Moderate-sized hiatal hernia. 6. Extensive  atherosclerotic disease including coronary artery disease. Electronically Signed   By: San Morelle M.D.   On: 01/17/2016 21:27   Dg Cystogram  01/18/2016  CLINICAL DATA:  Cystoscopy.  Left UPJ stone on CT. EXAM: CYSTOGRAM - 3+ VIEW; DG C-ARM 61-120 MIN FLUOROSCOPY TIME:  Radiation Exposure Index (as provided by the fluoroscopic device): Not provided. If the device does not provide the exposure index: Fluoroscopy Time (in minutes and seconds):  9 seconds Number of Acquired Images:  1 COMPARISON:  CT abdomen and pelvis 01/17/2016 FINDINGS: A single spot fluoroscopic image of the left kidney is obtained during cystoscopy. The image demonstrates contrast material in the left intrarenal collecting system with a pigtail catheter demonstrated in the renal pelvis and extending along the visualized ureter. Calices do not appear blunted. No discrete filling defects are appreciated. IMPRESSION: Spot fluoroscopic image obtained during cystoscopy demonstrates contrast material and pigtail catheter in the left renal pelvis. Electronically Signed   By: Lucienne Capers M.D.   On: 01/18/2016 02:31   Dg C-arm 1-60 Min  01/18/2016  CLINICAL DATA:  Cystoscopy.  Left UPJ stone on CT. EXAM: CYSTOGRAM - 3+ VIEW; DG C-ARM 61-120 MIN FLUOROSCOPY TIME:  Radiation Exposure Index (as provided by the fluoroscopic device): Not provided. If the device does not provide the exposure index: Fluoroscopy Time (in minutes and seconds):  9 seconds Number of Acquired Images:  1 COMPARISON:  CT abdomen and pelvis 01/17/2016 FINDINGS: A single spot fluoroscopic image of the left kidney is obtained during cystoscopy. The image demonstrates contrast material in the left intrarenal collecting system with a pigtail catheter demonstrated in the renal pelvis and extending along the visualized ureter. Calices do not appear blunted. No discrete filling defects are appreciated. IMPRESSION: Spot fluoroscopic image obtained during cystoscopy  demonstrates contrast material and pigtail catheter in the left renal pelvis. Electronically Signed   By: Lucienne Capers M.D.   On: 01/18/2016 02:31    Morning labs reviewed.    Assessment and Plan: LUPJ stone with sepsis from urinary tract infection improving post stent insertion.  Acute renal injury.  Chronic follicular cystitis.  Continue Rocephin pending cultures.  She will need definitive stone treatment in the next few weeks when she returns home.  LOS: 0 days    Malka So 01/18/2016 [id number]

## 2016-01-18 NOTE — Anesthesia Preprocedure Evaluation (Addendum)
Anesthesia Evaluation  Patient identified by MRN, date of birth, ID band Patient awake    Reviewed: Allergy & Precautions, NPO status , Patient's Chart, lab work & pertinent test results  Airway Mallampati: II  TM Distance: >3 FB Neck ROM: Full    Dental   Pulmonary asthma ,    breath sounds clear to auscultation       Cardiovascular hypertension,  Rhythm:Regular Rate:Normal     Neuro/Psych    GI/Hepatic negative GI ROS, Neg liver ROS,   Endo/Other  negative endocrine ROS  Renal/GU Renal disease     Musculoskeletal   Abdominal   Peds  Hematology   Anesthesia Other Findings   Reproductive/Obstetrics                           Anesthesia Physical Anesthesia Plan  ASA: III  Anesthesia Plan: General   Post-op Pain Management:    Induction: Intravenous  Airway Management Planned: Oral ETT  Additional Equipment:   Intra-op Plan:   Post-operative Plan: Extubation in OR  Informed Consent: I have reviewed the patients History and Physical, chart, labs and discussed the procedure including the risks, benefits and alternatives for the proposed anesthesia with the patient or authorized representative who has indicated his/her understanding and acceptance.   Dental advisory given  Plan Discussed with: CRNA and Anesthesiologist  Anesthesia Plan Comments:        Anesthesia Quick Evaluation

## 2016-01-19 DIAGNOSIS — I517 Cardiomegaly: Secondary | ICD-10-CM

## 2016-01-19 DIAGNOSIS — B9620 Unspecified Escherichia coli [E. coli] as the cause of diseases classified elsewhere: Secondary | ICD-10-CM

## 2016-01-19 LAB — BRAIN NATRIURETIC PEPTIDE: B NATRIURETIC PEPTIDE 5: 595.4 pg/mL — AB (ref 0.0–100.0)

## 2016-01-19 LAB — BASIC METABOLIC PANEL
ANION GAP: 6 (ref 5–15)
BUN: 16 mg/dL (ref 6–20)
CALCIUM: 8 mg/dL — AB (ref 8.9–10.3)
CO2: 23 mmol/L (ref 22–32)
Chloride: 110 mmol/L (ref 101–111)
Creatinine, Ser: 0.78 mg/dL (ref 0.44–1.00)
Glucose, Bld: 101 mg/dL — ABNORMAL HIGH (ref 65–99)
Potassium: 3.8 mmol/L (ref 3.5–5.1)
SODIUM: 139 mmol/L (ref 135–145)

## 2016-01-19 LAB — GLUCOSE, CAPILLARY
GLUCOSE-CAPILLARY: 109 mg/dL — AB (ref 65–99)
Glucose-Capillary: 109 mg/dL — ABNORMAL HIGH (ref 65–99)

## 2016-01-19 LAB — CBC
HEMATOCRIT: 31.8 % — AB (ref 36.0–46.0)
Hemoglobin: 9.9 g/dL — ABNORMAL LOW (ref 12.0–15.0)
MCH: 27.6 pg (ref 26.0–34.0)
MCHC: 31.1 g/dL (ref 30.0–36.0)
MCV: 88.6 fL (ref 78.0–100.0)
PLATELETS: 164 10*3/uL (ref 150–400)
RBC: 3.59 MIL/uL — ABNORMAL LOW (ref 3.87–5.11)
RDW: 14.9 % (ref 11.5–15.5)
WBC: 19.3 10*3/uL — AB (ref 4.0–10.5)

## 2016-01-19 LAB — LACTIC ACID, PLASMA: LACTIC ACID, VENOUS: 1.5 mmol/L (ref 0.5–2.0)

## 2016-01-19 MED ORDER — LORATADINE 10 MG PO TABS
10.0000 mg | ORAL_TABLET | Freq: Every day | ORAL | Status: DC
  Administered 2016-01-19 – 2016-01-20 (×2): 10 mg via ORAL
  Filled 2016-01-19 (×2): qty 1

## 2016-01-19 MED ORDER — KCL IN DEXTROSE-NACL 20-5-0.45 MEQ/L-%-% IV SOLN
INTRAVENOUS | Status: AC
  Administered 2016-01-19 (×2): via INTRAVENOUS
  Filled 2016-01-19 (×3): qty 1000

## 2016-01-19 NOTE — Care Management Important Message (Signed)
Important Message  Patient Details  Name: Dominique Mcdonald MRN: [id number] Date of Birth: [birth year]   Medicare Important Message Given:  Yes    Loann Quill 01/19/2016, 8:43 AM

## 2016-01-19 NOTE — Progress Notes (Signed)
RT placed patient on CPAP of 6 HS. 2L O2 bleed in needed. Patient tolerating well.

## 2016-01-19 NOTE — Discharge Summary (Signed)
Name: Dominique Mcdonald MRN: [id number] DOB: [birth year] 75 y.o. PCP: No Pcp Per Patient  Date of Admission: 01/17/2016  7:11 PM Date of Discharge: 01/20/2016 Attending Physician: Annia Belt, MD  Discharge Diagnosis: 1. Left UPJ Stone and Pyelonephritis 2. Pancreatic Incidentaloma  3. CHF   Principal Problem:   Left ureteral stone Active Problems:   Sepsis due to urinary tract infection (HCC)   Cystitis   Obstruction of left ureteropelvic junction (UPJ) due to stone  Discharge Medications:   Medication List    TAKE these medications        atenolol 100 MG tablet  Commonly known as:  TENORMIN  Take 100 mg by mouth daily.     atorvastatin 20 MG tablet  Commonly known as:  LIPITOR  Take 20 mg by mouth every evening.     ciprofloxacin 500 MG tablet  Commonly known as:  CIPRO  Take 1 tablet (500 mg total) by mouth 2 (two) times daily.     fluticasone 50 MCG/ACT nasal spray  Commonly known as:  FLONASE  Place 2 sprays into both nostrils daily.     oxybutynin 10 MG 24 hr tablet  Commonly known as:  DITROPAN-XL  Take 10 mg by mouth daily.     pantoprazole 40 MG tablet  Commonly known as:  PROTONIX  Take 40 mg by mouth daily.     PARoxetine 40 MG tablet  Commonly known as:  PAXIL  Take 40 mg by mouth daily.     QVAR 80 MCG/ACT inhaler  Generic drug:  beclomethasone  Inhale 1 puff into the lungs 2 (two) times daily.     rOPINIRole 1 MG tablet  Commonly known as:  REQUIP  Take 1 mg by mouth at bedtime.     TOVIAZ 8 MG Tb24 tablet  Generic drug:  fesoterodine  Take 8 mg by mouth daily.     ursodiol 300 MG capsule  Commonly known as:  ACTIGALL  Take 300 mg by mouth 2 (two) times daily.        Disposition and follow-up:   Ms.Charene Zahorik was discharged from [facility] in Stable condition.     Follow-up Appointments: Follow-up Information    Follow up with Malka So, MD.   Specialty:  Urology   Why:  Please make an appt  with your urologist in [place] to have the stone treated within the next month.    Contact information:   [place] [address] [phone]       Discharge Instructions: Discharge Instructions    Call MD for:  persistant nausea and vomiting    Complete by:  As directed      Call MD for:  severe uncontrolled pain    Complete by:  As directed      Call MD for:  temperature >100.4    Complete by:  As directed      Discharge instructions    Complete by:  As directed   Please keep up your fluid intake and follow-up with a urologist when you go to [place].  If you experience similar pain, please go to the ED immediately.           Consultations: Treatment Team:  Irine Seal, MD  Procedures Performed:  Ct Abdomen Pelvis W Contrast  01/17/2016  CLINICAL DATA:  Left lower quadrant abdominal pain and left flank pain beginning and Ing at today. EXAM: CT ABDOMEN AND PELVIS WITH CONTRAST TECHNIQUE: Multidetector CT  imaging of the abdomen and pelvis was performed using the standard protocol following bolus administration of intravenous contrast. CONTRAST:  1 ISOVUE-300 IOPAMIDOL (ISOVUE-300) INJECTION 61% COMPARISON:  None. FINDINGS: Lower chest: The lung bases demonstrate some chronic change. No focal airspace disease is present. Heart size is normal. Coronary artery calcifications are present. There also calcifications of the mitral valve. Hepatobiliary: An 11 mm lesion in the lateral aspect of the right lobe is compatible with a simple cyst. No other focal lesions are present. 11 mm stone is present at the neck of the gallbladder. There is no inflammatory change. The common bile duct is within normal limits. Pancreas: The a 2.1 cm low-density cysts is present in the tail the pancreas. No other discrete lesions are present. The pancreatic duct is within normal limits. Spleen: A single punctate calcification is present in the central aspect of the splaying. No other focal lesions are  present. Adrenals/Urinary Tract: The adrenal glands are normal bilaterally. A 12 mm benign appearing cysts is present at the upper pole of the right kidney. A second 12 mm cyst is present at the lower pole of the right kidney. Moderate left-sided hydronephrosis is present. An obstructing 12 mm stone is present at the left UPJ. The more distal ureters are within normal limits bilaterally. No additional stones are present. The urinary bladder is mostly collapsed. Stomach/Bowel: A moderate-sized hiatal hernia is present. The stomach is otherwise unremarkable. The duodenum is within normal limits. Small bowel is within normal limits is well. The appendix is visualized and normal. The cecum is unremarkable. The ascending transverse colon are within normal limits. The descending rectosigmoid colon are unremarkable. Vascular/Lymphatic: Extensive atherosclerotic calcifications are present in the aorta and branch vessels. There is no aneurysm. Reproductive: Uterus and adnexa are within normal limits for age. Other: No significant free fluid is present. Musculoskeletal: A vacuum disc is present at L5-S1. Multilevel facet degenerative changes are present in the lower lumbar spine. By vacuum discs in the lower thoracic spine is well. Vertebral body heights and alignment are maintained. The pelvis is intact. IMPRESSION: 1. Obstructing 12 mm stone at the left UPJ with moderate left-sided hydronephrosis. 2. Benign-appearing simple cysts at the upper and lower pole of the right kidney. 3. No additional nephrolithiasis. 4. Benign-appearing low-density lesion at the tail the pancreas likely represents a simple cyst. Recommend MRI of the pancreas with MRCP This recommendation follows ACR consensus guidelines: Managing Incidental Findings on Abdominal CT: White Paper of the ACR Incidental Findings Committee. J Am Coll Radiol 2010;7:754-773. 5. Moderate-sized hiatal hernia. 6. Extensive atherosclerotic disease including coronary artery  disease. Electronically Signed   By: San Morelle M.D.   On: 01/17/2016 21:27   Dg Cystogram  01/18/2016  CLINICAL DATA:  Cystoscopy.  Left UPJ stone on CT. EXAM: CYSTOGRAM - 3+ VIEW; DG C-ARM 61-120 MIN FLUOROSCOPY TIME:  Radiation Exposure Index (as provided by the fluoroscopic device): Not provided. If the device does not provide the exposure index: Fluoroscopy Time (in minutes and seconds):  9 seconds Number of Acquired Images:  1 COMPARISON:  CT abdomen and pelvis 01/17/2016 FINDINGS: A single spot fluoroscopic image of the left kidney is obtained during cystoscopy. The image demonstrates contrast material in the left intrarenal collecting system with a pigtail catheter demonstrated in the renal pelvis and extending along the visualized ureter. Calices do not appear blunted. No discrete filling defects are appreciated. IMPRESSION: Spot fluoroscopic image obtained during cystoscopy demonstrates contrast material and pigtail catheter in the left renal  pelvis. Electronically Signed   By: Lucienne Capers M.D.   On: 01/18/2016 02:31   Dg Chest Port 1 View  01/18/2016  CLINICAL DATA:  Pt has bibasilar crackles. Hx HTN, breast CA, asthma EXAM: PORTABLE CHEST 1 VIEW COMPARISON:  None. FINDINGS: The heart is mildly enlarged. There is prominence of interstitial markings throughout the lungs. There are no focal consolidations or pleural effusions. The surgical clips overlie the right mid chest. IMPRESSION: Changes consistent with congestive heart failure. Electronically Signed   By: Nolon Nations M.D.   On: 01/18/2016 16:09   Dg C-arm 1-60 Min  01/18/2016  CLINICAL DATA:  Cystoscopy.  Left UPJ stone on CT. EXAM: CYSTOGRAM - 3+ VIEW; DG C-ARM 61-120 MIN FLUOROSCOPY TIME:  Radiation Exposure Index (as provided by the fluoroscopic device): Not provided. If the device does not provide the exposure index: Fluoroscopy Time (in minutes and seconds):  9 seconds Number of Acquired Images:  1 COMPARISON:  CT  abdomen and pelvis 01/17/2016 FINDINGS: A single spot fluoroscopic image of the left kidney is obtained during cystoscopy. The image demonstrates contrast material in the left intrarenal collecting system with a pigtail catheter demonstrated in the renal pelvis and extending along the visualized ureter. Calices do not appear blunted. No discrete filling defects are appreciated. IMPRESSION: Spot fluoroscopic image obtained during cystoscopy demonstrates contrast material and pigtail catheter in the left renal pelvis. Electronically Signed   By: Lucienne Capers M.D.   On: 01/18/2016 02:31   Admission HPI: Ms. Abdon is a 75 year old lady with primary biliary cirrhosis, hypertension, breast cancer status-post chemotherapy and radiation, and known nephrolithiasis here with left-sided flank pain.  She knew she had a left-sided kidney stone and this was being followed by her urologist in [place] where she lives. She had just taken a trip to [place] for her [employer] graduation last week. She flew back to [place] to visit her son, who lives here, and was feeling well until this morning when she began feeling severe left flank pain that radiated into her groin, along with dysuria and subjective fevers. She denied any hematuria, change in her bowel movements, or prior history of urolithiasis; review of systems was otherwise non-revealing.  Urology placed a double J stent in the operating room; during the procedure, there was frank pus draining from the renal pelvis. When the stent was placed, the stone moved proximally back into the renal pelvis. She may need this definitively removed in the next 1-3 weeks.  Hospital Course by problem list: Principal Problem:   Left ureteral stone Active Problems:   Sepsis due to urinary tract infection (HCC)   Cystitis   Obstruction of left ureteropelvic junction (UPJ) due to stone   Left UPJ Kidney Stone and Pyelonephritis: Patient underwent left ureteral stenting, pushing the  stone back into the renal pelvis.  After stenting, pus was seen to drain from the renal pelvis.  Patient was started on IV Ceftriaxone while awaiting urine cultures.  Following stenting, patient's BP remained soft, 80-90s/40s.  She was given copious IVF and foley catheter was placed to monitor urine output.  She diuresed well overnight and creatinine improved from 1.45 on admission to 0.78 the following day.  Urine culture grew E coli sensitive to ampicillin/sulbactam, ampicillin, cefazolin, ceftriaxone, ciprofloxacin, gentamicin, imipenem, nitrofurantoin, piperacillin/tazobactam, TMP/SMX. She was transitioned to ciprofloxacin 500mg  twice daily for total 14 days (last dose through 6/25).  At follow up, please assess patient's completion of antibiotic course and plans for definitive therapy  of Left UPJ stone.  Pancreatic Incidentaloma: During CT scan of the abdomen, a 2.1 cm pancreatic tail cyst was seen.  While benign appearing, MRI and MRCP was recommended.  At follow up, please schedule patient for these procedures.  CHF: Patient became mildly hypoxic with inspiratory crackles following fluid resuscitation for her pyelonephritis.  CXR also demonstrated prominent interstitial markings and cardiomegaly consistent with CHF.  Patient denied any cardiac history.  At follow up, please assess patient's volume status and need for echocardiogram.   Discharge Vitals:   BP 150/60 mmHg  Pulse 83  Temp(Src) 98.5 F (36.9 C) (Oral)  Resp 17  Ht 5\' 5"  (1.651 m)  Wt 284 lb 6.3 oz (129 kg)  BMI 47.33 kg/m2  SpO2 95%  Discharge Labs:  Results for orders placed or performed during the hospital encounter of 01/17/16 (from the past 24 hour(s))  Glucose, capillary     Status: Abnormal   Collection Time: 01/19/16  9:50 PM  Result Value Ref Range   Glucose-Capillary 112 (H) 65 - 99 mg/dL  CBC with Differential/Platelet     Status: Abnormal   Collection Time: 01/20/16  6:21 AM  Result Value Ref Range   WBC 19.0  (H) 4.0 - 10.5 K/uL   RBC 3.59 (L) 3.87 - 5.11 MIL/uL   Hemoglobin 10.0 (L) 12.0 - 15.0 g/dL   HCT 31.7 (L) 36.0 - 46.0 %   MCV 88.3 78.0 - 100.0 fL   MCH 27.9 26.0 - 34.0 pg   MCHC 31.5 30.0 - 36.0 g/dL   RDW 14.9 11.5 - 15.5 %   Platelets 188 150 - 400 K/uL   Neutrophils Relative % 86 %   Neutro Abs 16.3 (H) 1.7 - 7.7 K/uL   Lymphocytes Relative 9 %   Lymphs Abs 1.8 0.7 - 4.0 K/uL   Monocytes Relative 4 %   Monocytes Absolute 0.8 0.1 - 1.0 K/uL   Eosinophils Relative 1 %   Eosinophils Absolute 0.1 0.0 - 0.7 K/uL   Basophils Relative 0 %   Basophils Absolute 0.0 0.0 - 0.1 K/uL  Basic metabolic panel     Status: Abnormal   Collection Time: 01/20/16  6:21 AM  Result Value Ref Range   Sodium 140 135 - 145 mmol/L   Potassium 4.0 3.5 - 5.1 mmol/L   Chloride 108 101 - 111 mmol/L   CO2 24 22 - 32 mmol/L   Glucose, Bld 138 (H) 65 - 99 mg/dL   BUN 7 6 - 20 mg/dL   Creatinine, Ser 0.55 0.44 - 1.00 mg/dL   Calcium 8.5 (L) 8.9 - 10.3 mg/dL   GFR calc non Af Amer >60 >60 mL/min   GFR calc Af Amer >60 >60 mL/min   Anion gap 8 5 - 15  Glucose, capillary     Status: Abnormal   Collection Time: 01/20/16  7:38 AM  Result Value Ref Range   Glucose-Capillary 142 (H) 65 - 99 mg/dL    Signed: Riccardo Dubin, MD 01/20/2016, 12:00 PM    Services Ordered on Discharge: none Equipment Ordered on Discharge: none

## 2016-01-19 NOTE — Progress Notes (Signed)
Subjective: Dominique Mcdonald.  She complains of her normal sinus congestion, and a cough productive of tan sputum.  She denies CP or SOB.  She denies fevers or chills.  She is urinating fine.  Objective: Vital signs in last 24 hours: Filed Vitals:   01/18/16 2310 01/19/16 0348 01/19/16 0350 01/19/16 0806  BP: 122/49  113/57   Pulse: 80  80   Temp:  97.9 F (36.6 C)  98.1 F (36.7 C)  TempSrc:  Oral  Oral  Resp: 25  26   Height:      Weight:      SpO2: 94%  94%    Weight change:   Intake/Output Summary (Last 24 hours) at 01/19/16 1052 Last data filed at 01/19/16 0954  Gross per 24 hour  Intake   4065 ml  Output   3001 ml  Net   1064 ml   Physical Exam  Constitutional: She is oriented to person, place, and time and well-developed, well-nourished, and in no distress. No distress.  HENT:  Head: Normocephalic and atraumatic.  Eyes: EOM are normal. No scleral icterus.  Neck: No tracheal deviation present.  Cardiovascular: Normal rate, regular rhythm, normal heart sounds and intact distal pulses.   Pulmonary/Chest: Effort normal. No stridor. No respiratory distress. She has no wheezes.  Moderate crackles in bilateral bases.  Abdominal: Soft. She exhibits no distension. There is no tenderness. There is no rebound and no guarding.  Musculoskeletal: She exhibits no edema.  Neurological: She is alert and oriented to person, place, and time.  Skin: Skin is warm and dry. She is not diaphoretic.    Lab Results: Basic Metabolic Panel:  Recent Labs Lab 01/18/16 0424 01/19/16 0258  NA 140 139  K 3.1* 3.8  CL 108 110  CO2 23 23  GLUCOSE 97 101*  BUN 17 16  CREATININE 1.45* 0.78  CALCIUM 7.9* 8.0*   Liver Function Tests:  Recent Labs Lab 01/17/16 1939  AST 46*  ALT 23  ALKPHOS 63  BILITOT 1.4*  PROT 7.2  ALBUMIN 3.7   No results for input(s): LIPASE, AMYLASE in the last 168 hours. No results for input(s): AMMONIA in the last 168 hours. CBC:  Recent Labs Lab  01/18/16 0424 01/19/16 0815  WBC 13.4* 19.3*  HGB 9.5* 9.9*  HCT 30.7* 31.8*  MCV 88.7 88.6  PLT 185 164   Cardiac Enzymes: No results for input(s): CKTOTAL, CKMB, CKMBINDEX, TROPONINI in the last 168 hours. BNP: No results for input(s): PROBNP in the last 168 hours. D-Dimer: No results for input(s): DDIMER in the last 168 hours. CBG:  Recent Labs Lab 01/18/16 0736 01/18/16 1142 01/18/16 1522 01/18/16 2106 01/19/16 0804  GLUCAP 90 88 80 107* 109*   Hemoglobin A1C: No results for input(s): HGBA1C in the last 168 hours. Fasting Lipid Panel: No results for input(s): CHOL, HDL, LDLCALC, TRIG, CHOLHDL, LDLDIRECT in the last 168 hours. Thyroid Function Tests: No results for input(s): TSH, T4TOTAL, FREET4, T3FREE, THYROIDAB in the last 168 hours. Coagulation: No results for input(s): LABPROT, INR in the last 168 hours. Anemia Panel: No results for input(s): VITAMINB12, FOLATE, FERRITIN, TIBC, IRON, RETICCTPCT in the last 168 hours. Urine Drug Screen: Drugs of Abuse  No results found for: LABOPIA, COCAINSCRNUR, LABBENZ, AMPHETMU, THCU, LABBARB  Alcohol Level: No results for input(s): ETH in the last 168 hours. Urinalysis:  Recent Labs Lab 01/17/16 2057  COLORURINE YELLOW  LABSPEC 1.020  PHURINE 6.0  GLUCOSEU NEGATIVE  HGBUR MODERATE*  BILIRUBINUR NEGATIVE  KETONESUR NEGATIVE  PROTEINUR NEGATIVE  NITRITE NEGATIVE  LEUKOCYTESUR MODERATE*   Misc. Labs:   Micro Results: Recent Results (from the past 240 hour(s))  Urine culture     Status: Abnormal (Preliminary result)   Collection Time: 01/17/16  8:57 PM  Result Value Ref Range Status   Specimen Description URINE, CATHETERIZED  Final   Special Requests NONE  Final   Culture >=100,000 COLONIES/mL ESCHERICHIA COLI (A)  Final   Report Status PENDING  Incomplete  Blood culture (routine x 2)     Status: None (Preliminary result)   Collection Time: 01/17/16 10:53 PM  Result Value Ref Range Status   Specimen  Description BLOOD LEFT WRIST  Final   Special Requests BOTTLES DRAWN AEROBIC AND ANAEROBIC 5CC  Final   Culture NO GROWTH < 24 HOURS  Final   Report Status PENDING  Incomplete  Blood culture (routine x 2)     Status: None (Preliminary result)   Collection Time: 01/17/16 11:01 PM  Result Value Ref Range Status   Specimen Description BLOOD LEFT HAND  Final   Special Requests BOTTLES DRAWN AEROBIC AND ANAEROBIC 5CC  Final   Culture NO GROWTH < 24 HOURS  Final   Report Status PENDING  Incomplete  MRSA PCR Screening     Status: None   Collection Time: 01/18/16  4:55 AM  Result Value Ref Range Status   MRSA by PCR NEGATIVE NEGATIVE Final    Comment:        The GeneXpert MRSA Assay (FDA approved for NASAL specimens only), is one component of a comprehensive MRSA colonization surveillance program. It is not intended to diagnose MRSA infection nor to guide or monitor treatment for MRSA infections. Performed at [facility]    Studies/Results: Ct Abdomen Pelvis W Contrast  01/17/2016  CLINICAL DATA:  Left lower quadrant abdominal pain and left flank pain beginning and Ing at today. EXAM: CT ABDOMEN AND PELVIS WITH CONTRAST TECHNIQUE: Multidetector CT imaging of the abdomen and pelvis was performed using the standard protocol following bolus administration of intravenous contrast. CONTRAST:  1 ISOVUE-300 IOPAMIDOL (ISOVUE-300) INJECTION 61% COMPARISON:  None. FINDINGS: Lower chest: The lung bases demonstrate some chronic change. No focal airspace disease is present. Heart size is normal. Coronary artery calcifications are present. There also calcifications of the mitral valve. Hepatobiliary: An 11 mm lesion in the lateral aspect of the right lobe is compatible with a simple cyst. No other focal lesions are present. 11 mm stone is present at the neck of the gallbladder. There is no inflammatory change. The common bile duct is within normal limits. Pancreas: The a 2.1 cm  low-density cysts is present in the tail the pancreas. No other discrete lesions are present. The pancreatic duct is within normal limits. Spleen: A single punctate calcification is present in the central aspect of the splaying. No other focal lesions are present. Adrenals/Urinary Tract: The adrenal glands are normal bilaterally. A 12 mm benign appearing cysts is present at the upper pole of the right kidney. A second 12 mm cyst is present at the lower pole of the right kidney. Moderate left-sided hydronephrosis is present. An obstructing 12 mm stone is present at the left UPJ. The more distal ureters are within normal limits bilaterally. No additional stones are present. The urinary bladder is mostly collapsed. Stomach/Bowel: A moderate-sized hiatal hernia is present. The stomach is otherwise unremarkable. The duodenum is within normal limits. Small bowel is within normal limits is well. The appendix  is visualized and normal. The cecum is unremarkable. The ascending transverse colon are within normal limits. The descending rectosigmoid colon are unremarkable. Vascular/Lymphatic: Extensive atherosclerotic calcifications are present in the aorta and branch vessels. There is no aneurysm. Reproductive: Uterus and adnexa are within normal limits for age. Other: No significant free fluid is present. Musculoskeletal: A vacuum disc is present at L5-S1. Multilevel facet degenerative changes are present in the lower lumbar spine. By vacuum discs in the lower thoracic spine is well. Vertebral body heights and alignment are maintained. The pelvis is intact. IMPRESSION: 1. Obstructing 12 mm stone at the left UPJ with moderate left-sided hydronephrosis. 2. Benign-appearing simple cysts at the upper and lower pole of the right kidney. 3. No additional nephrolithiasis. 4. Benign-appearing low-density lesion at the tail the pancreas likely represents a simple cyst. Recommend MRI of the pancreas with MRCP This recommendation follows  ACR consensus guidelines: Managing Incidental Findings on Abdominal CT: White Paper of the ACR Incidental Findings Committee. J Am Coll Radiol 2010;7:754-773. 5. Moderate-sized hiatal hernia. 6. Extensive atherosclerotic disease including coronary artery disease. Electronically Signed   By: San Morelle M.D.   On: 01/17/2016 21:27   Dg Cystogram  01/18/2016  CLINICAL DATA:  Cystoscopy.  Left UPJ stone on CT. EXAM: CYSTOGRAM - 3+ VIEW; DG C-ARM 61-120 MIN FLUOROSCOPY TIME:  Radiation Exposure Index (as provided by the fluoroscopic device): Not provided. If the device does not provide the exposure index: Fluoroscopy Time (in minutes and seconds):  9 seconds Number of Acquired Images:  1 COMPARISON:  CT abdomen and pelvis 01/17/2016 FINDINGS: A single spot fluoroscopic image of the left kidney is obtained during cystoscopy. The image demonstrates contrast material in the left intrarenal collecting system with a pigtail catheter demonstrated in the renal pelvis and extending along the visualized ureter. Calices do not appear blunted. No discrete filling defects are appreciated. IMPRESSION: Spot fluoroscopic image obtained during cystoscopy demonstrates contrast material and pigtail catheter in the left renal pelvis. Electronically Signed   By: Lucienne Capers M.D.   On: 01/18/2016 02:31   Dg Chest Port 1 View  01/18/2016  CLINICAL DATA:  Pt has bibasilar crackles. Hx HTN, breast CA, asthma EXAM: PORTABLE CHEST 1 VIEW COMPARISON:  None. FINDINGS: The heart is mildly enlarged. There is prominence of interstitial markings throughout the lungs. There are no focal consolidations or pleural effusions. The surgical clips overlie the right mid chest. IMPRESSION: Changes consistent with congestive heart failure. Electronically Signed   By: Nolon Nations M.D.   On: 01/18/2016 16:09   Dg C-arm 1-60 Min  01/18/2016  CLINICAL DATA:  Cystoscopy.  Left UPJ stone on CT. EXAM: CYSTOGRAM - 3+ VIEW; DG C-ARM 61-120  MIN FLUOROSCOPY TIME:  Radiation Exposure Index (as provided by the fluoroscopic device): Not provided. If the device does not provide the exposure index: Fluoroscopy Time (in minutes and seconds):  9 seconds Number of Acquired Images:  1 COMPARISON:  CT abdomen and pelvis 01/17/2016 FINDINGS: A single spot fluoroscopic image of the left kidney is obtained during cystoscopy. The image demonstrates contrast material in the left intrarenal collecting system with a pigtail catheter demonstrated in the renal pelvis and extending along the visualized ureter. Calices do not appear blunted. No discrete filling defects are appreciated. IMPRESSION: Spot fluoroscopic image obtained during cystoscopy demonstrates contrast material and pigtail catheter in the left renal pelvis. Electronically Signed   By: Lucienne Capers M.D.   On: 01/18/2016 02:31   Medications: I have reviewed the  patient's current medications. Scheduled Meds: . cefTRIAXone (ROCEPHIN)  IV  1 g Intravenous Q24H  . enoxaparin (LOVENOX) injection  40 mg Subcutaneous Q24H  . insulin aspart  0-9 Units Subcutaneous TID WC  . loratadine  10 mg Oral Daily  . rOPINIRole  1 mg Oral QHS   Continuous Infusions: . dextrose 5 % and 0.45 % NaCl with KCl 20 mEq/L     PRN Meds:.acetaminophen, albuterol, bisacodyl, HYDROcodone-acetaminophen, HYDROmorphone (DILAUDID) injection, magnesium hydroxide, ondansetron, oxybutynin, sodium phosphate, zolpidem Assessment/Plan: Principal Problem:   Left ureteral stone Active Problems:   Sepsis due to urinary tract infection (HCC)   Cystitis   Obstruction of left ureteropelvic junction (UPJ) due to stone  Ms. Yong is a 75 year old lady with primary biliary cirrhosis, hypertension, and known nephrolithiasis here with left-sided ureteropelvic urolithiasis with pyelonephritis.  Left UPJ Urolithiasis with Pyelonephritis s/p Ureteral Stent: Urology placed a double J stent during cystoscopy with visualization of frank  pus draining from the renal pelvis. When the stent was placed, the stone moved proximally back into the renal pelvis. She will need this definitively removed in the next 1-3 weeks. Her pain has resolved and creatinine improving.  UCx growing E coli with sensitivities pending.  White count elevated compared to yesterday, but we will continue IV ceftriaxone empirically until sensitivities result.  Will also continue IVF at a low rate to replace losses from post-obstructive diuresis. - CTX IV - D5 1/2 NS @ 75 cc/hr [ ]  UCx sensitivities: E coli - Norco 5-325 1-2 tabs q4h PRN Pain - Strict I/O  Cardiomegaly: CXR demonstrates enlarged cardiac silhouette and interstitial markings c/w CHF.  Patient has no known cardiac history.  Will recommend outpatient work up to assess cardiac function. - Outpatient echo after discharge  Hypotension, resolved: Patient's BPs currently stable at 80s/40s.  She is asymptomatic.  States her BPs at home usually 130/70.  BPs stable and lactate normal.  - CTM  Incidental lesion on the pancreatic head: This was seen on her abdominal CT and was favored to be benign, but the radiologist recommended MRI with MRCP to confirm the benign nature. She does have a 60 pack year smoking history and she has a personal history of breast cancer, but she denies weight loss and her LFTs are non-obstructive. - Outpatient MRI and MRCP upon discharge  Primary biliary cirrhosis: Her LFTs appear normal and she does not have overt ascites on exam or CT - Claritin 10 mg daily - Diphenhydramine 12.5 mg q6h PRN itching  Hypertension: She is normally on lisinopril (dosage unknown). We will hold for now. -HOLD Lisinopril  OSA: Not an acute issue. - CPAP.  Dispo: Disposition is deferred at this time, awaiting improvement of current medical problems.  Anticipated discharge in approximately 2-3 day(s).   The patient does have a current PCP (No Pcp Per Patient) and does need an [facility] hospital  follow-up appointment after discharge.  The patient does not have transportation limitations that hinder transportation to clinic appointments.  .Services Needed at time of discharge: Y = Yes, Blank = No PT:   OT:   RN:   Equipment:   Other:     LOS: 1 day   Iline Oven, MD 01/19/2016, 10:52 AM

## 2016-01-19 NOTE — Anesthesia Postprocedure Evaluation (Signed)
Anesthesia Post Note  Patient: Nzinga Greenspan  Procedure(s) Performed: Procedure(s) (LRB): CYSTOSCOPY WITH RETROGRADE PYELOGRAM/URETERAL STENT PLACEMENT (Left)  Patient location during evaluation: PACU Anesthesia Type: General Level of consciousness: awake Pain management: pain level controlled Vital Signs Assessment: post-procedure vital signs reviewed and stable Respiratory status: spontaneous breathing Cardiovascular status: stable Anesthetic complications: no    Last Vitals:  Filed Vitals:   01/19/16 1426 01/19/16 1430  BP: 114/48   Pulse: 90   Temp:  37.1 C  Resp: 27     Last Pain:  Filed Vitals:   01/19/16 1506  PainSc: Asleep                 EDWARDS,Treniyah Lynn

## 2016-01-20 ENCOUNTER — Encounter (HOSPITAL_COMMUNITY): Payer: Self-pay | Admitting: Oncology

## 2016-01-20 DIAGNOSIS — I509 Heart failure, unspecified: Secondary | ICD-10-CM

## 2016-01-20 DIAGNOSIS — G4733 Obstructive sleep apnea (adult) (pediatric): Secondary | ICD-10-CM | POA: Insufficient documentation

## 2016-01-20 DIAGNOSIS — N201 Calculus of ureter: Secondary | ICD-10-CM

## 2016-01-20 DIAGNOSIS — I5021 Acute systolic (congestive) heart failure: Secondary | ICD-10-CM | POA: Insufficient documentation

## 2016-01-20 DIAGNOSIS — A4151 Sepsis due to Escherichia coli [E. coli]: Secondary | ICD-10-CM | POA: Insufficient documentation

## 2016-01-20 DIAGNOSIS — Z9989 Dependence on other enabling machines and devices: Secondary | ICD-10-CM

## 2016-01-20 LAB — CBC WITH DIFFERENTIAL/PLATELET
Basophils Absolute: 0 10*3/uL (ref 0.0–0.1)
Basophils Relative: 0 %
Eosinophils Absolute: 0.1 10*3/uL (ref 0.0–0.7)
Eosinophils Relative: 1 %
HEMATOCRIT: 31.7 % — AB (ref 36.0–46.0)
Hemoglobin: 10 g/dL — ABNORMAL LOW (ref 12.0–15.0)
LYMPHS ABS: 1.8 10*3/uL (ref 0.7–4.0)
LYMPHS PCT: 9 %
MCH: 27.9 pg (ref 26.0–34.0)
MCHC: 31.5 g/dL (ref 30.0–36.0)
MCV: 88.3 fL (ref 78.0–100.0)
MONO ABS: 0.8 10*3/uL (ref 0.1–1.0)
MONOS PCT: 4 %
NEUTROS ABS: 16.3 10*3/uL — AB (ref 1.7–7.7)
Neutrophils Relative %: 86 %
Platelets: 188 10*3/uL (ref 150–400)
RBC: 3.59 MIL/uL — ABNORMAL LOW (ref 3.87–5.11)
RDW: 14.9 % (ref 11.5–15.5)
WBC: 19 10*3/uL — ABNORMAL HIGH (ref 4.0–10.5)

## 2016-01-20 LAB — BASIC METABOLIC PANEL
Anion gap: 8 (ref 5–15)
BUN: 7 mg/dL (ref 6–20)
CALCIUM: 8.5 mg/dL — AB (ref 8.9–10.3)
CO2: 24 mmol/L (ref 22–32)
CREATININE: 0.55 mg/dL (ref 0.44–1.00)
Chloride: 108 mmol/L (ref 101–111)
GFR calc Af Amer: >60 mL/min (ref >60)
GFR calc non Af Amer: >60 mL/min (ref >60)
GLUCOSE: 138 mg/dL — AB (ref 65–99)
Potassium: 4 mmol/L (ref 3.5–5.1)
Sodium: 140 mmol/L (ref 135–145)

## 2016-01-20 LAB — GLUCOSE, CAPILLARY
Glucose-Capillary: 112 mg/dL — ABNORMAL HIGH (ref 65–99)
Glucose-Capillary: 142 mg/dL — ABNORMAL HIGH (ref 65–99)

## 2016-01-20 LAB — URINE CULTURE

## 2016-01-20 MED ORDER — CIPROFLOXACIN HCL 500 MG PO TABS: 500.0000 mg | ORAL_TABLET | Freq: Two times a day (BID) | ORAL | Status: AC

## 2016-01-20 NOTE — Evaluation (Signed)
Physical Therapy Evaluation and Discharge Patient Details Name: Marlicia Eskola MRN: [id number] DOB: [birth year] Today's Date: 01/20/2016   History of Present Illness  75 year old woman visiting [place] from [place] who developed the acute onset of fever, dysuria, and excruciating left flank pain. She has known nephrolithiasis which has been asymptomatic until now. CT scan done in the emergency department shows a obstructing 12 mm stone at the left UPJ junction with moderate left-sided hydronephrosis. She underwent cystoscopy with insertion of a left double-J stent    Clinical Impression  Patient evaluated by Physical Therapy with no further acute PT needs identified. All education has been completed and the patient has no further questions. Maintained SaO2 89-91% on room air with ambulation. +dyspnea (a bit worse than her usual she reports). See below for any follow-up Physical Therapy or equipment needs. PT is signing off. Thank you for this referral.     Follow Up Recommendations No PT follow up    Equipment Recommendations  None recommended by PT    Recommendations for Other Services       Precautions / Restrictions Precautions Precautions: Other (comment) Precaution Comments: monitor saO2      Mobility  Bed Mobility                  Transfers Overall transfer level: Independent Equipment used: None                Ambulation/Gait Ambulation/Gait assistance: Supervision Ambulation Distance (Feet): 90 Feet Assistive device: None Gait Pattern/deviations: Step-through pattern;Decreased stride length;Wide base of support     General Gait Details: slow due to SOB  Stairs            Wheelchair Mobility    Modified Rankin (Stroke Patients Only)       Balance Overall balance assessment: Independent (turning head walking no impairments)                                           Pertinent Vitals/Pain Pain Assessment:  Faces Faces Pain Scale: Hurts little more Pain Location: Lt thigh; Lt flank Pain Descriptors / Indicators: Grimacing;Discomfort Pain Intervention(s): Limited activity within patient's tolerance;Monitored during session    Home Living Family/patient expects to be discharged to:: Private residence Living Arrangements: Spouse/significant other;Children;Other relatives (visiting son & family in [place]) Available Help at Discharge: Family;Available 24 hours/day Type of Home: House Home Access: Stairs to enter;Level entry (level into basement apartment; stairs into main level) Entrance Stairs-Rails: Right Entrance Stairs-Number of Steps: 6 (garage into main level) Home Layout: Two level;Laundry or work area in basement;Able to live on main level with [employer]: None (none here (lives in [place]))      Prior Function Level of Independence: Independent               Hand Dominance        Extremity/Trunk Assessment   Upper Extremity Assessment: LUE deficits/detail (Lt rotator cuff tear)           Lower Extremity Assessment: Overall WFL for tasks assessed      Cervical / Trunk Assessment: Normal  Communication   Communication: HOH  Cognition Arousal/Alertness: Awake/alert Behavior During Therapy: WFL for tasks assessed/performed Overall Cognitive Status: Within Functional Limits for tasks assessed                      General Comments General  comments (skin integrity, edema, etc.): Husband present     Exercises        Assessment/Plan    PT Assessment [place] does not need any further PT services  PT Diagnosis Difficulty walking   PT Problem List    PT Treatment Interventions     PT Goals (Current goals can be found in the Care Plan section) Acute Rehab PT Goals Patient Stated Goal: fly to [place] PT Goal Formulation: All assessment and education complete, DC therapy    Frequency     Barriers to discharge         Co-evaluation               End of Session Equipment Utilized During Treatment: Gait belt Activity Tolerance: Patient limited by fatigue Patient left: in chair;with call bell/phone within reach;with family/visitor present           Time: [id number] PT Time Calculation (min) (ACUTE ONLY): 19 min   Charges:   PT Evaluation $PT Eval Low Complexity: 1 Procedure     PT G Codes:        Simisola Sandles 2016-02-04, 9:55 AM Pager [phone]

## 2016-01-20 NOTE — Progress Notes (Signed)
Pt d/c'd home in stable condition. Reviewed d/c instructions with patient and her husband. Internal medicine MD gave pt prescription for cipro. Pt's husband her a question for PT about whether pt could walk stairs. Did not note anything in PT notes about not to walk stairs this was told to pt. Also paged PT about husband's question but pt and her husband was ready to leave and did not want to stay for PT to call back . Staff assisted pt by wheelchair with her husband to their transportation.

## 2016-01-20 NOTE — Progress Notes (Addendum)
Subjective: This morning, she does not report any complaints from overnight, like fever, abdominal pain, urinary symptoms. She looks forward to going home today.  Objective: Vital signs in last 24 hours: Filed Vitals:   01/19/16 2146 01/19/16 2302 01/20/16 0505 01/20/16 0805  BP: 152/60  157/62 150/60  Pulse: 88 85 86 83  Temp: 98.3 F (36.8 C)  98.6 F (37 C) 98.5 F (36.9 C)  TempSrc: Oral  Oral Oral  Resp: 16 16 18 17   Height: 5\' 5"  (1.651 m)     Weight: 284 lb 6.3 oz (129 kg)     SpO2: 95% 95% 95% 95%   Weight change:   Intake/Output Summary (Last 24 hours) at 01/20/16 1106 Last data filed at 01/20/16 0900  Gross per 24 hour  Intake 2316.25 ml  Output   3400 ml  Net -1083.75 ml   Physical Exam  Constitutional: She is oriented to person, place, and time and well-developed, well-nourished, and in no distress. No distress.  HENT:  Head: Normocephalic and atraumatic.  Eyes: EOM are normal. No scleral icterus.  Neck: No tracheal deviation present.  Cardiovascular: Normal rate, regular rhythm, normal heart sounds and intact distal pulses.   Pulmonary/Chest: Effort normal. No stridor. No respiratory distress. She has no wheezes.  Moderate crackles in bilateral bases.  Abdominal: Soft. She exhibits no distension. There is no tenderness. There is no rebound and no guarding.  Musculoskeletal: She exhibits no edema.  Neurological: She is alert and oriented to person, place, and time.  Skin: Skin is warm and dry. She is not diaphoretic.    Lab Results: Basic Metabolic Panel:  Recent Labs Lab 01/19/16 0258 01/20/16 0621  NA 139 140  K 3.8 4.0  CL 110 108  CO2 23 24  GLUCOSE 101* 138*  BUN 16 7  CREATININE 0.78 0.55  CALCIUM 8.0* 8.5*   Liver Function Tests:  Recent Labs Lab 01/17/16 1939  AST 46*  ALT 23  ALKPHOS 63  BILITOT 1.4*  PROT 7.2  ALBUMIN 3.7   CBC:  Recent Labs Lab 01/19/16 0815 01/20/16 0621  WBC 19.3* 19.0*  NEUTROABS  --  16.3*    HGB 9.9* 10.0*  HCT 31.8* 31.7*  MCV 88.6 88.3  PLT 164 188   CBG:  Recent Labs Lab 01/18/16 1522 01/18/16 2106 01/19/16 0804 01/19/16 1159 01/19/16 2150 01/20/16 0738  GLUCAP 80 107* 109* 109* 112* 142*   Urinalysis:  Recent Labs Lab 01/17/16 2057  COLORURINE YELLOW  LABSPEC 1.020  PHURINE 6.0  GLUCOSEU NEGATIVE  HGBUR MODERATE*  BILIRUBINUR NEGATIVE  KETONESUR NEGATIVE  PROTEINUR NEGATIVE  NITRITE NEGATIVE  LEUKOCYTESUR MODERATE*   Misc. Labs:   Micro Results: Recent Results (from the past 240 hour(s))  Urine culture     Status: Abnormal   Collection Time: 01/17/16  8:57 PM  Result Value Ref Range Status   Specimen Description URINE, CATHETERIZED  Final   Special Requests NONE  Final   Culture >=100,000 COLONIES/mL ESCHERICHIA COLI (A)  Final   Report Status 01/20/2016 FINAL  Final   Organism ID, Bacteria ESCHERICHIA COLI (A)  Final      Susceptibility   Escherichia coli - MIC*    AMPICILLIN <=2 SENSITIVE Sensitive     CEFAZOLIN <=4 SENSITIVE Sensitive     CEFTRIAXONE <=1 SENSITIVE Sensitive     CIPROFLOXACIN <=0.25 SENSITIVE Sensitive     GENTAMICIN <=1 SENSITIVE Sensitive     IMIPENEM <=0.25 SENSITIVE Sensitive     NITROFURANTOIN <=  16 SENSITIVE Sensitive     TRIMETH/SULFA <=20 SENSITIVE Sensitive     AMPICILLIN/SULBACTAM <=2 SENSITIVE Sensitive     PIP/TAZO <=4 SENSITIVE Sensitive     * >=100,000 COLONIES/mL ESCHERICHIA COLI  Blood culture (routine x 2)     Status: None (Preliminary result)   Collection Time: 01/17/16 10:53 PM  Result Value Ref Range Status   Specimen Description BLOOD LEFT WRIST  Final   Special Requests BOTTLES DRAWN AEROBIC AND ANAEROBIC 5CC  Final   Culture NO GROWTH 2 DAYS  Final   Report Status PENDING  Incomplete  Blood culture (routine x 2)     Status: None (Preliminary result)   Collection Time: 01/17/16 11:01 PM  Result Value Ref Range Status   Specimen Description BLOOD LEFT HAND  Final   Special Requests BOTTLES  DRAWN AEROBIC AND ANAEROBIC 5CC  Final   Culture NO GROWTH 2 DAYS  Final   Report Status PENDING  Incomplete  MRSA PCR Screening     Status: None   Collection Time: 01/18/16  4:55 AM  Result Value Ref Range Status   MRSA by PCR NEGATIVE NEGATIVE Final    Comment:        The GeneXpert MRSA Assay (FDA approved for NASAL specimens only), is one component of a comprehensive MRSA colonization surveillance program. It is not intended to diagnose MRSA infection nor to guide or monitor treatment for MRSA infections. Performed at [facility]    Studies/Results: Dg Chest Port 1 View  01/18/2016  CLINICAL DATA:  Pt has bibasilar crackles. Hx HTN, breast CA, asthma EXAM: PORTABLE CHEST 1 VIEW COMPARISON:  None. FINDINGS: The heart is mildly enlarged. There is prominence of interstitial markings throughout the lungs. There are no focal consolidations or pleural effusions. The surgical clips overlie the right mid chest. IMPRESSION: Changes consistent with congestive heart failure. Electronically Signed   By: Nolon Nations M.D.   On: 01/18/2016 16:09   Medications: I have reviewed the patient's current medications. Scheduled Meds: . cefTRIAXone (ROCEPHIN)  IV  1 g Intravenous Q24H  . enoxaparin (LOVENOX) injection  40 mg Subcutaneous Q24H  . insulin aspart  0-9 Units Subcutaneous TID WC  . loratadine  10 mg Oral Daily  . rOPINIRole  1 mg Oral QHS   Continuous Infusions:   PRN Meds:.acetaminophen, albuterol, bisacodyl, HYDROcodone-acetaminophen, HYDROmorphone (DILAUDID) injection, magnesium hydroxide, ondansetron, oxybutynin, sodium phosphate, zolpidem Assessment/Plan: Principal Problem:   Left ureteral stone Active Problems:   Sepsis due to urinary tract infection (HCC)   Cystitis   Obstruction of left ureteropelvic junction (UPJ) due to stone  Dominique Mcdonald is a 75 year old lady with primary biliary cirrhosis, hypertension, and known nephrolithiasis here with  left-sided ureteropelvic urolithiasis with pyelonephritis.  Left UPJ Urolithiasis with Pyelonephritis s/p Ureteral Stent: Urology placed a double J stent during cystoscopy with visualization of frank pus draining from the renal pelvis. When the stent was placed, the stone moved proximally back into the renal pelvis. She will need this definitively removed in the next 1-3 weeks. Her pain has resolved and creatinine improving.  UCx growing E coli with sensitivities pending.  White count stable at 19 and will transition ceftriaxone to ciprofloxacin 500mg  twice daily today to complete total 14-day course.  - Prescribe ciprofloxacin 500mg  twice daily through 6/26  Cardiomegaly: CXR demonstrates enlarged cardiac silhouette and interstitial markings c/w CHF.  Patient has no known cardiac history.  Will recommend outpatient work up to assess cardiac function. -Consier outpatient  echo after discharge  Incidental lesion on the pancreatic head: This was seen on her abdominal CT and was favored to be benign, but the radiologist recommended MRI with MRCP to confirm the benign nature. She does have a 60 pack year smoking history and she has a personal history of breast cancer, but she denies weight loss and her LFTs are non-obstructive. - Outpatient MRI and MRCP upon discharge  Primary biliary cirrhosis: Her LFTs appear normal and she does not have overt ascites on exam or CT - Claritin 10 mg daily  Hypertension: BP trending 114-157/48-62.  - Resume home lisinopril 10mg   OSA: Not an acute issue. - CPAP.  Dispo: Disposition is deferred at this time, awaiting improvement of current medical problems.  Anticipated discharge today.   The patient does have a current PCP (No Pcp Per Patient) and does need an [facility] hospital follow-up appointment after discharge.  The patient does not have transportation limitations that hinder transportation to clinic appointments.  .Services Needed at time of discharge: Y = Yes,  Blank = No PT:   OT:   RN:   Equipment:   Other:     LOS: 2 days   Riccardo Dubin, MD 01/20/2016, 11:06 AM

## 2016-01-22 LAB — CULTURE, BLOOD (ROUTINE X 2)
Culture: NO GROWTH
Culture: NO GROWTH

## 2017-03-02 IMAGING — CT CT ABD-PELV W/ CM
2 of 5 series · 15 of 46 positions shown, 17 images · IV contrast (APPLIED)
Comparison: None.

CLINICAL DATA: Left lower quadrant abdominal pain and left flank
pain beginning and [place]aki at today.

EXAM:
CT ABDOMEN AND PELVIS WITH CONTRAST
TECHNIQUE: Multidetector CT imaging of the abdomen and pelvis was performed
using the standard protocol following bolus administration of
intravenous contrast.
CONTRAST:  1 [id number] IOPAMIDOL ([id number]) INJECTION 61%

[Series 2: abd/ pelvis 5.0 i30f 1 · axial · 0.98mm/px · z∈[+793,+1218]mm · 12 of 97 slices shown, 14 images]
[im 6/97  soft-tissue]
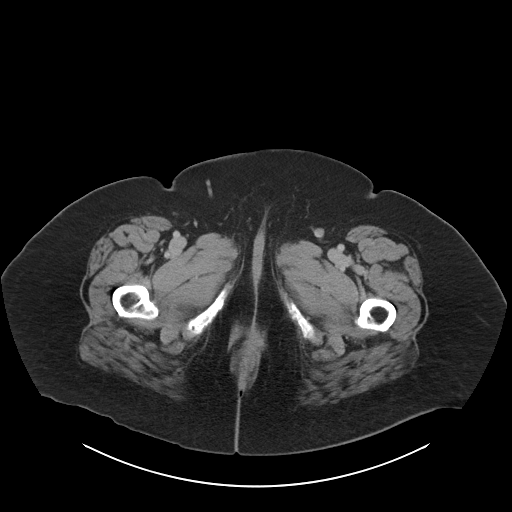
[im 6/97  bone]
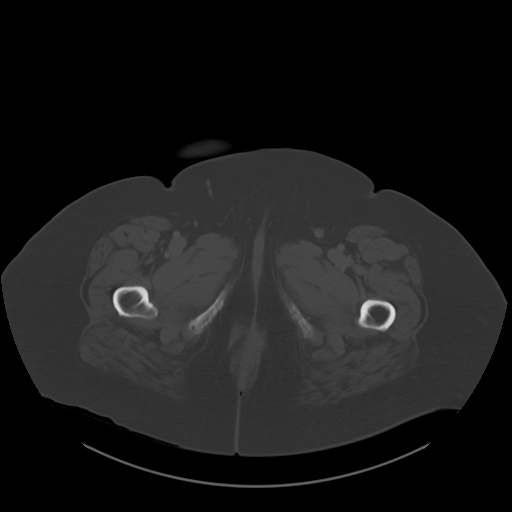
[im 16/97  soft-tissue]
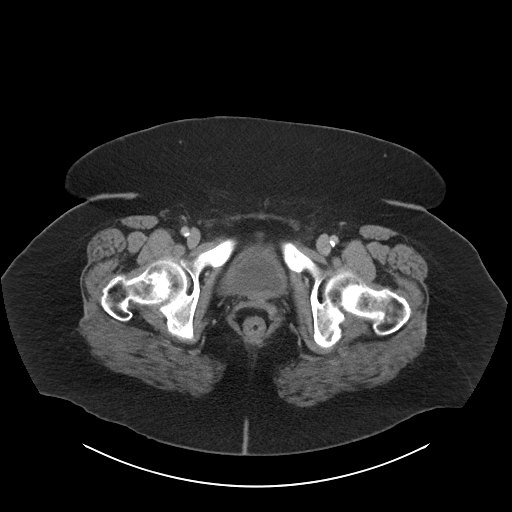
[im 21/97  soft-tissue]
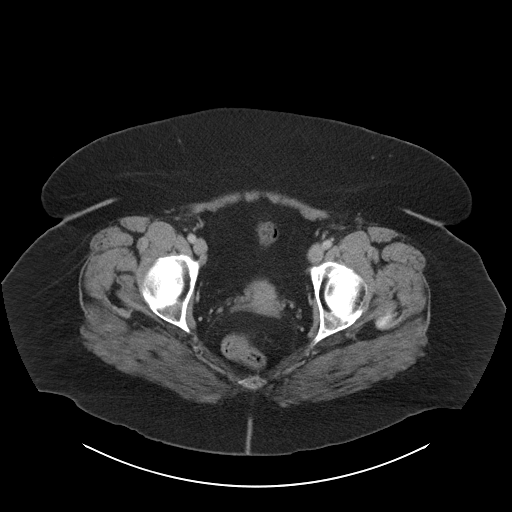
[im 31/97  soft-tissue]
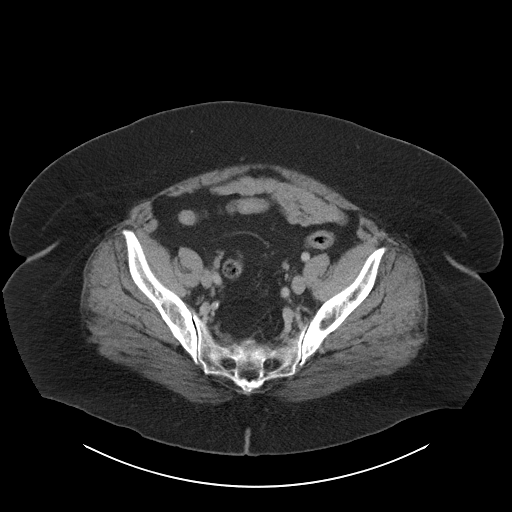
[im 36/97  soft-tissue]
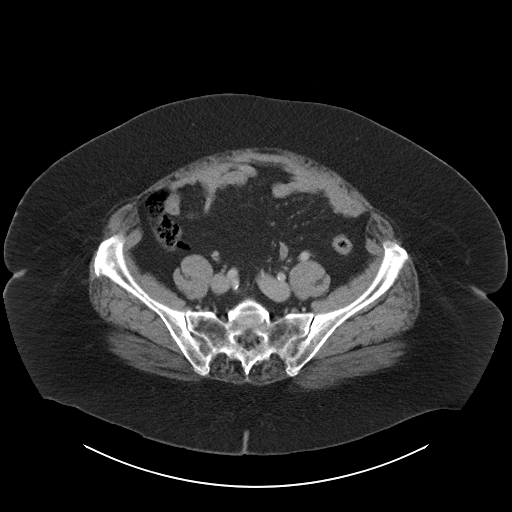
[im 46/97  soft-tissue]
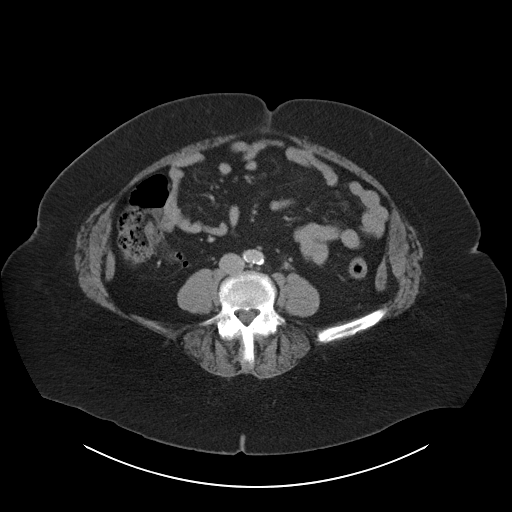
[im 51/97  soft-tissue]
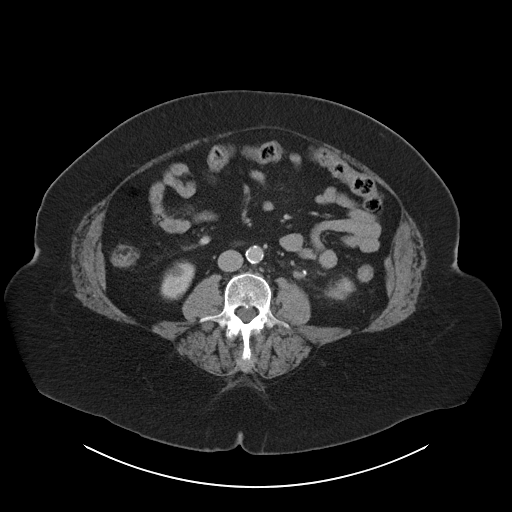
[im 61/97  soft-tissue]
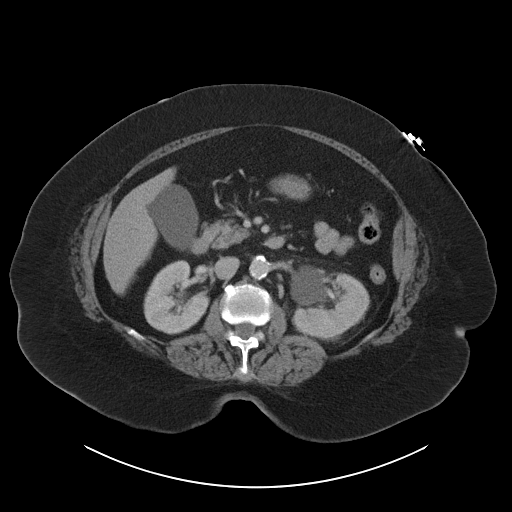
[im 66/97  soft-tissue]
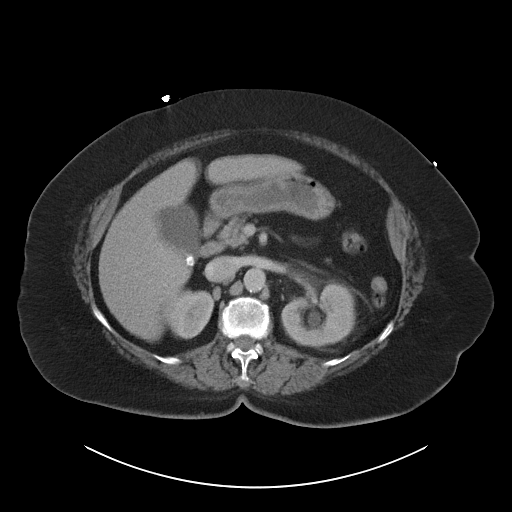
[im 66/97  bone]
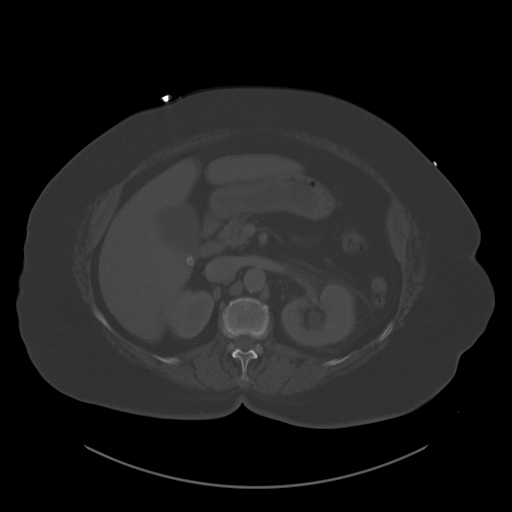
[im 76/97  soft-tissue]
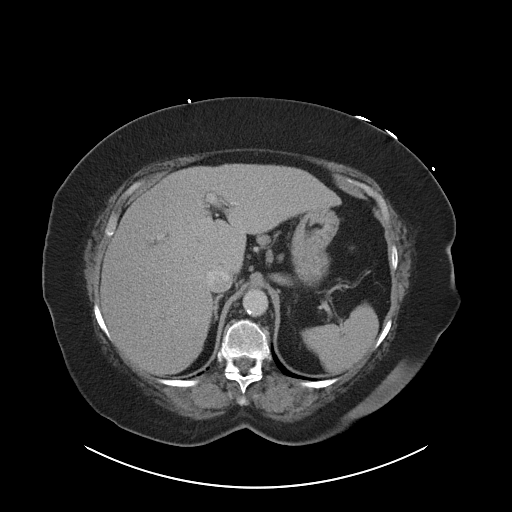
[im 81/97  soft-tissue]
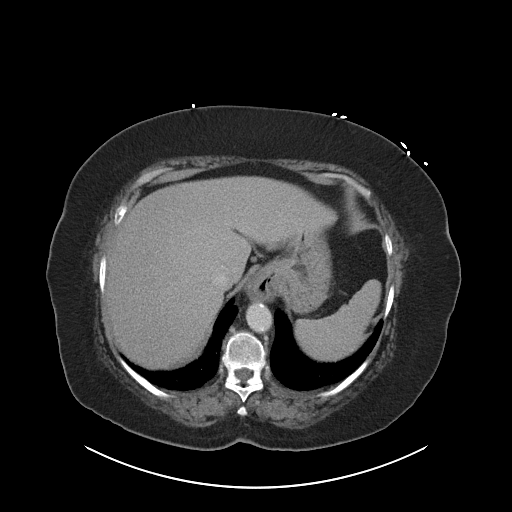
[im 91/97  soft-tissue]
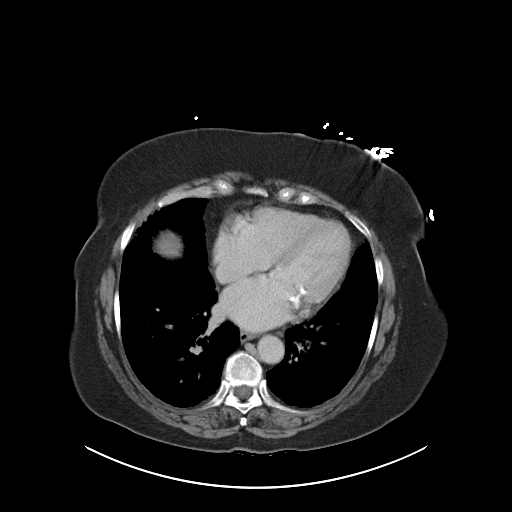

[Series 5: coronal soft tissue · coronal · 0.97mm/px · 3 of 110 slices shown]
[im 37/110  soft-tissue]
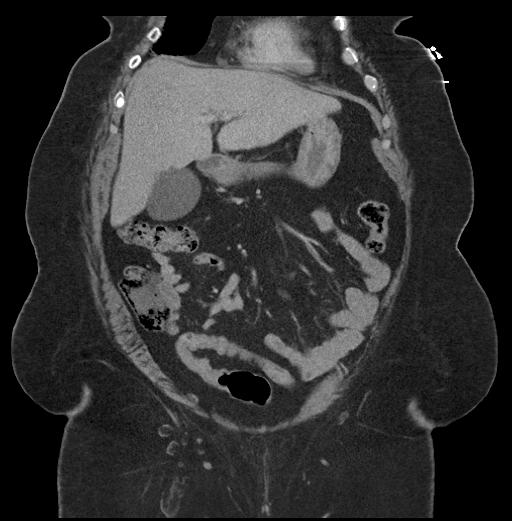
[im 49/110  soft-tissue]
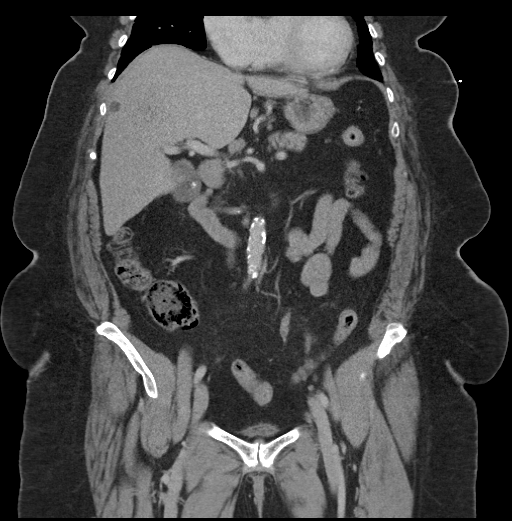
[im 61/110  soft-tissue]
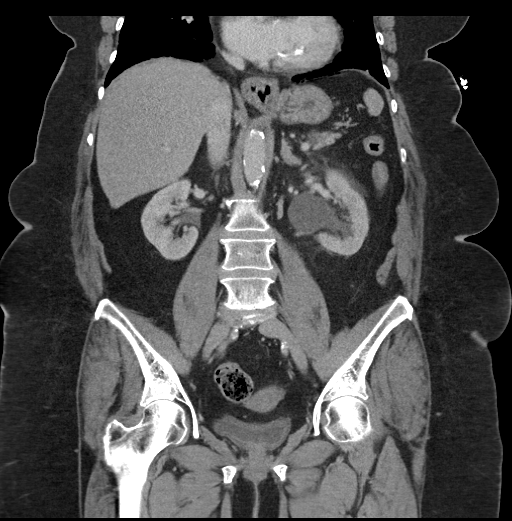

[15 of 46 positions shown; findings below may reference images not displayed]

FINDINGS: Lower chest: The lung bases demonstrate some chronic change. No
focal airspace disease is present. Heart size is normal. Coronary
artery calcifications are present. There also calcifications of the
mitral valve.

Hepatobiliary: An 11 mm lesion in the lateral aspect of the right
lobe is compatible with a simple cyst. No other focal lesions are
present. 11 mm stone is present at the neck of the gallbladder.
There is no inflammatory change. The common bile duct is within
normal limits.

Pancreas: The a 2.1 cm low-density cysts is present in the tail the
pancreas. No other discrete lesions are present. The pancreatic duct
is within normal limits.

Spleen: A single punctate calcification is present in the central
aspect of the splaying. No other focal lesions are present.

Adrenals/Urinary Tract: The adrenal glands are normal bilaterally. A
12 mm benign appearing cysts is present at the upper pole of the
right kidney. A second 12 mm cyst is present at the lower pole of
the right kidney.

Moderate left-sided hydronephrosis is present. An obstructing 12 mm
stone is present at the left UPJ. The more distal ureters are within
normal limits bilaterally. No additional stones are present. The
urinary bladder is mostly collapsed.

Stomach/Bowel: A moderate-sized hiatal hernia is present. The
stomach is otherwise unremarkable. The duodenum is within normal
limits. Small bowel is within normal limits is well.

The appendix is visualized and normal. The cecum is unremarkable.
The ascending transverse colon are within normal limits. The
descending rectosigmoid colon are unremarkable.

Vascular/Lymphatic: Extensive atherosclerotic calcifications are
present in the aorta and branch vessels. There is no aneurysm.

Reproductive: Uterus and adnexa are within normal limits for age.

Other: No significant free fluid is present.

Musculoskeletal: A vacuum disc is present at L5-S1. Multilevel facet
degenerative changes are present in the lower lumbar spine. By
vacuum discs in the lower thoracic spine is well. Vertebral body
heights and alignment are maintained. The pelvis is intact.
IMPRESSION: 1. Obstructing 12 mm stone at the left UPJ with moderate left-sided
hydronephrosis.
2. Benign-appearing simple cysts at the upper and lower pole of the
right kidney.
3. No additional nephrolithiasis.
4. Benign-appearing low-density lesion at the tail the pancreas
likely represents a simple cyst. Recommend MRI of the pancreas with
MRCP This recommendation follows ACR consensus guidelines: Managing
Incidental Findings on Abdominal CT: White Paper of the ACR
Incidental Findings Committee. [HOSPITAL] 3484;[DATE].
5. Moderate-sized hiatal hernia.
6. Extensive atherosclerotic disease including coronary artery
disease.

## 2017-03-03 IMAGING — RF DG CYSTOGRAM 3+V
1 series · 1 of 1 positions shown · non-contrast
Comparison: CT abdomen and pelvis 01/17/2016

CLINICAL DATA: Cystoscopy.  Left UPJ stone on CT.

EXAM:
CYSTOGRAM - 3+ VIEW; DG C-ARM 61-120 MIN
FLUOROSCOPY TIME:  Radiation Exposure Index (as provided by the
fluoroscopic device): Not provided.
If the device does not provide the exposure index:
Fluoroscopy Time (in minutes and seconds):  9 seconds
Number of Acquired Images:  1

[Series 1: run · 1 of 1 slices shown]
[im 1/1]
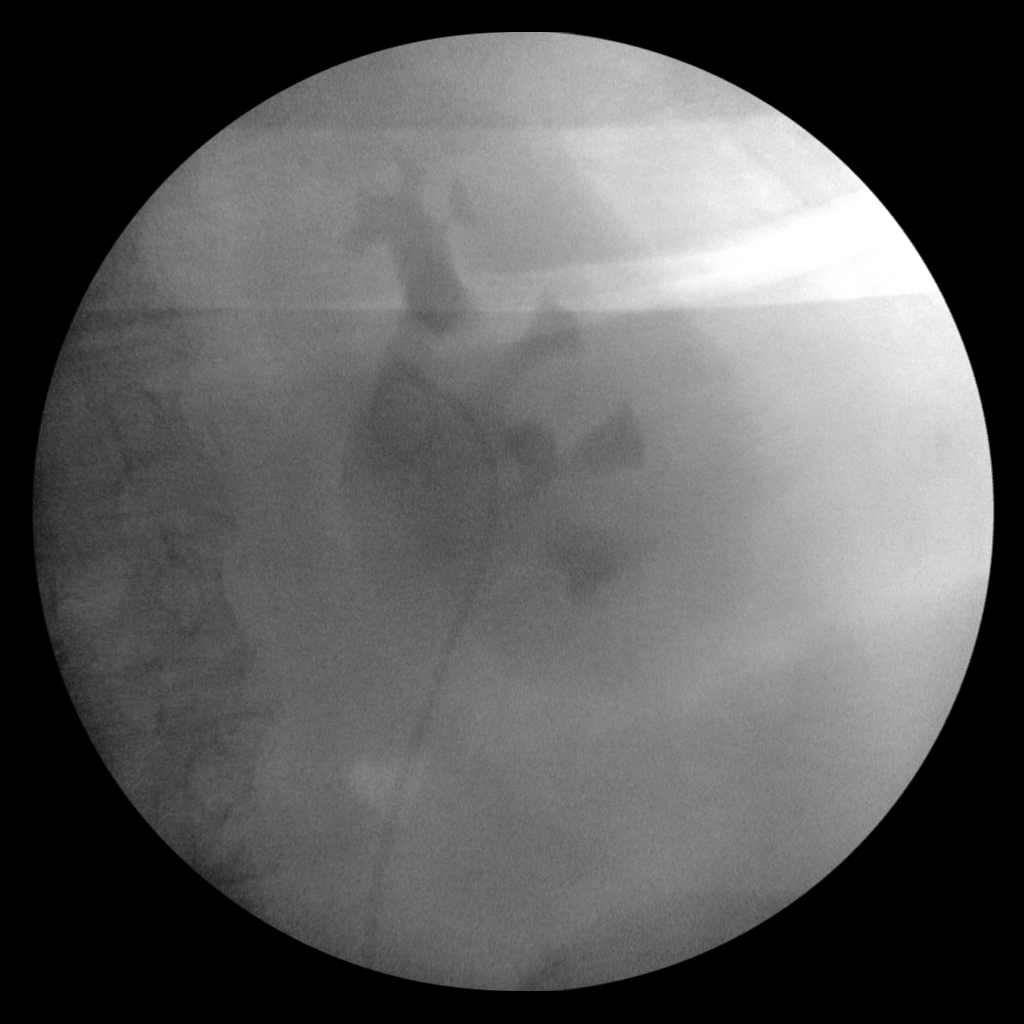

[1 of 1 positions shown; findings below may reference images not displayed]

FINDINGS: A single spot fluoroscopic image of the left kidney is obtained
during cystoscopy. The image demonstrates contrast material in the
left intrarenal collecting system with a pigtail catheter
demonstrated in the renal pelvis and extending along the visualized
ureter. Calices do not appear blunted. No discrete filling defects
are appreciated.
IMPRESSION: Spot fluoroscopic image obtained during cystoscopy demonstrates
contrast material and pigtail catheter in the left renal pelvis.
